# Patient Record
Sex: Male | Born: 1985
Health system: Southern US, Community
[De-identification: ages and names within clinical notes are randomized; demographics above are authoritative.]

## PROBLEM LIST (undated history)

## (undated) DIAGNOSIS — E785 Hyperlipidemia, unspecified: Secondary | ICD-10-CM

## (undated) DIAGNOSIS — Z8669 Personal history of other diseases of the nervous system and sense organs: Secondary | ICD-10-CM

## (undated) DIAGNOSIS — R4586 Emotional lability: Secondary | ICD-10-CM

## (undated) DIAGNOSIS — I10 Essential (primary) hypertension: Secondary | ICD-10-CM

## (undated) DIAGNOSIS — Z973 Presence of spectacles and contact lenses: Secondary | ICD-10-CM

## (undated) DIAGNOSIS — C439 Malignant melanoma of skin, unspecified: Secondary | ICD-10-CM

## (undated) HISTORY — DX: Essential (primary) hypertension: I10

## (undated) HISTORY — DX: Personal history of other diseases of the nervous system and sense organs: Z86.69

## (undated) HISTORY — DX: Emotional lability: R45.86

## (undated) HISTORY — DX: Hyperlipidemia, unspecified: E78.5

## (undated) HISTORY — DX: Malignant melanoma of skin, unspecified: C43.9

## (undated) HISTORY — DX: Presence of spectacles and contact lenses: Z97.3

---

## 2013-12-02 ENCOUNTER — Ambulatory Visit (INDEPENDENT_AMBULATORY_CARE_PROVIDER_SITE_OTHER): Payer: Managed Care, Other (non HMO) | Admitting: Family Medicine

## 2013-12-02 ENCOUNTER — Encounter: Payer: Self-pay | Admitting: Family Medicine

## 2013-12-02 VITALS — BP 130/80 | HR 78 | Resp 16 | Ht 69.0 in | Wt 196.0 lb

## 2013-12-02 DIAGNOSIS — Z8269 Family history of other diseases of the musculoskeletal system and connective tissue: Secondary | ICD-10-CM

## 2013-12-02 DIAGNOSIS — F411 Generalized anxiety disorder: Secondary | ICD-10-CM

## 2013-12-02 DIAGNOSIS — R079 Chest pain, unspecified: Secondary | ICD-10-CM

## 2013-12-02 DIAGNOSIS — Z0000 Encounter for general adult medical examination without abnormal findings: Secondary | ICD-10-CM

## 2013-12-02 LAB — COMPLETE METABOLIC PANEL WITH GFR
ALT: 13 U/L (ref 0–53)
AST: 12 U/L (ref 0–37)
Albumin: 4.9 g/dL (ref 3.5–5.2)
Alkaline Phosphatase: 63 U/L (ref 39–117)
BILIRUBIN TOTAL: 0.7 mg/dL (ref 0.2–1.2)
BUN: 13 mg/dL (ref 6–23)
CALCIUM: 10.2 mg/dL (ref 8.4–10.5)
CHLORIDE: 102 meq/L (ref 96–112)
CO2: 30 meq/L (ref 19–32)
CREATININE: 1.19 mg/dL (ref 0.50–1.35)
GFR, EST NON AFRICAN AMERICAN: 83 mL/min
GFR, Est African American: >89 mL/min
GLUCOSE: 94 mg/dL (ref 70–99)
Potassium: 5 mEq/L (ref 3.5–5.3)
Sodium: 138 mEq/L (ref 135–145)
Total Protein: 7.7 g/dL (ref 6.0–8.3)

## 2013-12-02 LAB — LIPID PANEL
CHOL/HDL RATIO: 5.5 ratio
CHOLESTEROL: 214 mg/dL — AB (ref 0–200)
HDL: 39 mg/dL — ABNORMAL LOW (ref >39)
LDL Cholesterol: 141 mg/dL — ABNORMAL HIGH (ref 0–99)
Triglycerides: 170 mg/dL — ABNORMAL HIGH (ref <150)
VLDL: 34 mg/dL (ref 0–40)

## 2013-12-02 LAB — CBC WITH DIFFERENTIAL/PLATELET
Basophils Absolute: 0 10*3/uL (ref 0.0–0.1)
Basophils Relative: 0 % (ref 0–1)
Eosinophils Absolute: 0.1 10*3/uL (ref 0.0–0.7)
Eosinophils Relative: 1 % (ref 0–5)
HCT: 47.4 % (ref 39.0–52.0)
HEMOGLOBIN: 16.3 g/dL (ref 13.0–17.0)
LYMPHS ABS: 3.5 10*3/uL (ref 0.7–4.0)
Lymphocytes Relative: 46 % (ref 12–46)
MCH: 29.1 pg (ref 26.0–34.0)
MCHC: 34.4 g/dL (ref 30.0–36.0)
MCV: 84.5 fL (ref 78.0–100.0)
MONOS PCT: 7 % (ref 3–12)
Monocytes Absolute: 0.5 10*3/uL (ref 0.1–1.0)
NEUTROS ABS: 3.5 10*3/uL (ref 1.7–7.7)
Neutrophils Relative %: 46 % (ref 43–77)
Platelets: 356 10*3/uL (ref 150–400)
RBC: 5.61 MIL/uL (ref 4.22–5.81)
RDW: 12.7 % (ref 11.5–15.5)
WBC: 7.6 10*3/uL (ref 4.0–10.5)

## 2013-12-02 NOTE — Patient Instructions (Addendum)
Keeping you healthy  Get these tests  Blood pressure- Have your blood pressure checked once a year by your healthcare provider.  Normal blood pressure is 120/80.  Weight- Have your body mass index (BMI) calculated to screen for obesity.  BMI is a measure of body fat based on height and weight. You can also calculate your own BMI at [employer]velBags.it.  Cholesterol- Have your cholesterol checked regularly starting at age 28, sooner may be necessary if you have diabetes, high blood pressure, if a family member developed heart diseases at an early age or if you smoke.   Chlamydia, HIV, and other sexual transmitted disease- Get screened each year until the age of 39 then within three months of each new sexual partner.  Diabetes- Have your blood sugar checked regularly if you have high blood pressure, high cholesterol, a family history of diabetes or if you are overweight.  Get these vaccines  Flu shot- Every fall.  Tetanus shot- Every 10 years.  Menactra- Single dose; prevents meningitis.  Take these steps  Don't smoke- If you do smoke, ask your healthcare provider about quitting. For tips on how to quit, go to www.smokefree.gov or call 1-800-QUIT-NOW.  Be physically active- Exercise 5 days a week for at least 30 minutes.  If you are not already physically active start slow and gradually work up to 30 minutes of moderate physical activity.  Examples of moderate activity include walking briskly, mowing the yard, dancing, swimming bicycling, etc.  Eat a healthy diet- Eat a variety of healthy foods such as fruits, vegetables, low fat milk, low fat cheese, yogurt, lean meats, poultry, fish, beans, tofu, etc.  For more information on healthy eating, go to www.thenutritionsource.org  Drink alcohol in moderation- Limit alcohol intake two drinks or less a day.  Never drink and drive.  Dentist- Brush and floss teeth twice daily; visit your dentis twice a year.  Depression-Your emotional  health is as important as your physical health.  If you're feeling down, losing interest in things you normally enjoy please talk with your healthcare provider.  Gun Safety- If you keep a gun in your home, keep it unloaded and with the safety lock on.  Bullets should be stored separately.  Helmet use- Always wear a helmet when riding a motorcycle, bicycle, rollerblading or skateboarding.  Safe sex- If you may be exposed to a sexually transmitted infection, use a condom  Seat belts- Seat bels can save your life; always wear one.  Smoke/Carbon Monoxide detectors- These detectors need to be installed on the appropriate level of your home.  Replace batteries at least once a year.  Skin Cancer- When out in the sun, cover up and use sunscreen SPF 15 or higher.  Violence- If anyone is threatening or hurting you, please tell your healthcare provider.    Generalized Anxiety Disorder Generalized anxiety disorder (GAD) is a mental disorder. It interferes with life functions, including relationships, work, and school. GAD is different from normal anxiety, which everyone experiences at some point in their lives in response to specific life events and activities. Normal anxiety actually helps [place] prepare for and get through these life events and activities. Normal anxiety goes away after the event or activity is over.  GAD causes anxiety that is not necessarily related to specific events or activities. It also causes excess anxiety in proportion to specific events or activities. The anxiety associated with GAD is also difficult to control. GAD can vary from mild to severe. People with severe GAD  can have intense waves of anxiety with physical symptoms (panic attacks).  SYMPTOMS The anxiety and worry associated with GAD are difficult to control. This anxiety and worry are related to many life events and activities and also occur more days than not for 6 months or longer. People with GAD also have three or  more of the following symptoms (one or more in children):  Restlessness.   Fatigue.  Difficulty concentrating.   Irritability.  Muscle tension.  Difficulty sleeping or unsatisfying sleep. DIAGNOSIS GAD is diagnosed through an assessment by your caregiver. Your caregiver will ask you questions aboutyour mood,physical symptoms, and events in your life. Your caregiver may ask you about your medical history and use of alcohol or drugs, including prescription medications. Your caregiver may also do a physical exam and blood tests. Certain medical conditions and the use of certain substances can cause symptoms similar to those associated with GAD. Your caregiver may refer you to a mental health specialist for further evaluation. TREATMENT The following therapies are usually used to treat GAD:   Medication Antidepressant medication usually is prescribed for long-term daily control. Antianxiety medications may be added in severe cases, especially when panic attacks occur.   Talk therapy (psychotherapy) Certain types of talk therapy can be helpful in treating GAD by providing support, education, and guidance. A form of talk therapy called cognitive behavioral therapy can teach you healthy ways to think about and react to daily life events and activities.  Stress managementtechniques These include yoga, meditation, and exercise and can be very helpful when they are practiced regularly. A mental health specialist can help determine which treatment is best for you. Some people see improvement with one therapy. However, other people require a combination of therapies. Document Released: 01/11/2013 Document Reviewed: 01/11/2013 [facility] Patient Information 2014 [place], [place].

## 2013-12-02 NOTE — Progress Notes (Signed)
Subjective:    Patient ID: Mark Dunn, male    DOB: June 06, 1986, 28 y.o.   MRN: [id number]  HPI  This 28 y.o. Cauc male is here to establish care at [facility]. His last CPE was early 2014 at a practice in [place], [place]. He and his wife share a diagnosis of anxiety; this problem is situational for the pt and is accompanied by SOB and sharp chest pains in the L axilla, lasting < 5 minutes.  He does consume caffeine (one 12- ounce soda minimum, 5 sodas maximum); he cannot drink Pepsi because it gives him migraines.  He works as a Development worker, international aid; CP not exacerbated by work. Pt and wife suffered a stillborn birth of child when they were in high school (she was 28 yrs old and having a home birth; she delivered a stillborn at their local hospital).  Family history significant for brother diagnosed w/ Fredderick Phenix Danlos Syndrome. Pt has no history of abnormal joint laxity or dislocations or easy bruising or slow healing.  Review of Systems  Constitutional: Negative.   HENT:       Frequent URIs and colds.  Eyes: Positive for photophobia. Negative for pain, redness and visual disturbance.  Respiratory: Positive for chest tightness and shortness of breath. Negative for cough, choking and wheezing.   Cardiovascular: Positive for chest pain. Negative for palpitations and leg swelling.  Gastrointestinal: Negative.   Endocrine: Negative.   Musculoskeletal: Negative.   Skin: Negative.   Allergic/Immunologic: Negative.   Neurological: Positive for speech difficulty and headaches. Negative for dizziness, tremors, syncope, weakness, light-headedness and numbness.  Hematological: Negative.   Psychiatric/Behavioral: Negative for behavioral problems, sleep disturbance, dysphoric mood and agitation. The patient is nervous/anxious. The patient is not hyperactive.        Objective:   Physical Exam  Nursing note and vitals reviewed. Constitutional: He is oriented to person, place, and time. Vital signs are normal. He  appears well-developed and well-nourished. No distress.  HENT:  Head: Normocephalic and atraumatic.  Right Ear: Hearing, tympanic membrane, external ear and ear canal normal.  Left Ear: Hearing, tympanic membrane, external ear and ear canal normal.  Nose: Nose normal. No nasal deformity or septal deviation.  Mouth/Throat: Uvula is midline, oropharynx is clear and moist and mucous membranes are normal. No oral lesions. Normal dentition.  Eyes: Conjunctivae, EOM and lids are normal. Pupils are equal, round, and reactive to light. No scleral icterus.  Fundoscopic exam:      The right eye shows no arteriolar narrowing, no AV nicking and no papilledema. The right eye shows red reflex.       The left eye shows no arteriolar narrowing, no AV nicking and no papilledema. The left eye shows red reflex.  Neck: Trachea normal, normal range of motion and full passive range of motion without pain. Neck supple. No spinous process tenderness and no muscular tenderness present. No mass and no thyromegaly present.  Cardiovascular: Normal rate, regular rhythm, S1 normal, S2 normal, normal heart sounds and normal pulses.   No extrasystoles are present. PMI is not displaced.  Exam reveals no gallop and no friction rub.   No murmur heard. Pulmonary/Chest: Effort normal and breath sounds normal. No respiratory distress.  Abdominal: Soft. Normal appearance and bowel sounds are normal. He exhibits no distension, no abdominal bruit and no mass. There is no hepatosplenomegaly. There is no tenderness. There is no rigidity, no guarding and no CVA tenderness. No hernia.  Musculoskeletal: Normal range of motion. He exhibits no  edema and no tenderness.       Cervical back: Normal.       Thoracic back: Normal.       Lumbar back: Normal.  Neurological: He is alert and oriented to person, place, and time. He has normal strength. He displays no atrophy and no tremor. No cranial nerve deficit or sensory deficit. He exhibits normal  muscle tone. Coordination and gait normal.  Reflex Scores:      Tricep reflexes are 1+ on the right side and 1+ on the left side.      Bicep reflexes are 1+ on the right side and 1+ on the left side.      Brachioradialis reflexes are 0 on the right side and 0 on the left side.      Patellar reflexes are 2+ on the right side and 2+ on the left side. Skin: Skin is warm, dry and intact. No ecchymosis, no petechiae and no rash noted. He is not diaphoretic. No cyanosis or erythema. No pallor. Nails show no clubbing.  Psychiatric: He has a normal mood and affect. His speech is normal and behavior is normal. Judgment and thought content normal. Cognition and memory are normal.    ECG:  NSR; rate= 58. No ST-TW changes. No ectopy.       Assessment & Plan:  Routine general medical examination at a health care facility - Plan: TSH, COMPLETE METABOLIC PANEL WITH GFR, CBC with Differential, Lipid panel, T4, free, T3, Free  Generalized anxiety disorder - Pt will continue to work on healthy ways to address anxiety symptoms and will reduce caffeine intake.  Plan: TSH, COMPLETE METABOLIC PANEL WITH GFR, CBC with Differential, Lipid panel, T4, free, T3, Free  Chest pain - Anxiety related.  Limit caffeine intake. Plan: TSH, COMPLETE METABOLIC PANEL WITH GFR, CBC with Differential, Lipid panel, T4, free, T3, Free, Vitamin D, 25-hydroxy  Family history of musculoskeletal disease - Ehlers Danlos Syndrome in brother who has "aortic inflammation and enlarged heart" per pt hx. Plan: Ambulatory referral to [facility]

## 2013-12-03 LAB — TSH: TSH: 1.072 u[IU]/mL (ref 0.350–4.500)

## 2013-12-03 LAB — T4, FREE: Free T4: 1.23 ng/dL (ref 0.80–1.80)

## 2013-12-03 LAB — VITAMIN D 25 HYDROXY (VIT D DEFICIENCY, FRACTURES): VIT D 25 HYDROXY: 41 ng/mL (ref 30–89)

## 2013-12-03 LAB — T3, FREE: T3, Free: 3.3 pg/mL (ref 2.3–4.2)

## 2013-12-03 NOTE — Progress Notes (Signed)
Quick Note:  Please advise pt regarding following labs... Your lipid panel shows above normal total and LDL ("bad") cholesterol as well as elevated triglycerides. HDL ("good") cholesterol is just barely normal. Better nutrition will help improve these numbers. If this is a problem that runs in your family, the best nutrition will not get your numbers to normal. Your heart disease risk is average based on these numbers.   Get an over-the-counter Fish Oil supplement 1200 mg and take 1 capsule daily. Take any oil-containing supplements alone (separate from other solid pills and capsules). Petra Kuba Made is a good brand; Schiff MegaRed Astrid Drafts is a very good but more costly brand. Stay active and eat healthy; these numbers should be rechecked in 6 months.  All other labs look great!  Copy to pt. ______

## 2013-12-06 ENCOUNTER — Encounter: Payer: Self-pay | Admitting: Family Medicine

## 2014-07-08 ENCOUNTER — Ambulatory Visit (INDEPENDENT_AMBULATORY_CARE_PROVIDER_SITE_OTHER): Payer: Managed Care, Other (non HMO) | Admitting: Family Medicine

## 2014-07-08 VITALS — BP 145/80 | HR 72 | Temp 98.4°F | Resp 18 | Ht 70.0 in | Wt 208.0 lb

## 2014-07-08 DIAGNOSIS — J029 Acute pharyngitis, unspecified: Secondary | ICD-10-CM

## 2014-07-08 LAB — POCT RAPID STREP A (OFFICE): Rapid Strep A Screen: NEGATIVE

## 2014-07-08 NOTE — Progress Notes (Signed)
   Subjective:    Patient ID: Mark Dunn, male    DOB: Aug 16, 1986, 28 y.o.   MRN: [id number]  HPI Patient presents with scratchy, sore throat and dry cough for 5 days. Had a bad headache 4 days ago, mild headache today. Took some claritin d and mucinex with a little relief.    Review of Systems No fever/chills, no ear pain, no chest pain, no SOB, no wheeze. Sleeping ok, appetite normal.     Objective:   Physical Exam  Vitals reviewed. Constitutional: He is oriented to person, place, and time. He appears well-developed and well-nourished.  HENT:  Head: Normocephalic and atraumatic.  Right Ear: Tympanic membrane, external ear and ear canal normal.  Left Ear: Tympanic membrane, external ear and ear canal normal.  Nose: Mucosal edema present. No rhinorrhea. Right sinus exhibits no maxillary sinus tenderness and no frontal sinus tenderness. Left sinus exhibits no maxillary sinus tenderness and no frontal sinus tenderness.  Mouth/Throat: Uvula is midline and mucous membranes are normal. Posterior oropharyngeal erythema present. No oropharyngeal exudate, posterior oropharyngeal edema or tonsillar abscesses.  Sounds congested.   Eyes: Conjunctivae are normal.  Neck: Normal range of motion. Neck supple.  Cardiovascular: Normal rate, regular rhythm and normal heart sounds.   Pulmonary/Chest: Effort normal and breath sounds normal.  Musculoskeletal: Normal range of motion.  Lymphadenopathy:    He has no cervical adenopathy.  Neurological: He is alert and oriented to person, place, and time.  Skin: Skin is warm and dry.  Psychiatric: He has a normal mood and affect. His behavior is normal. Judgment and thought content normal.      Assessment & Plan:  1. Acute pharyngitis, unspecified pharyngitis type - POCT rapid strep A - Culture, Group A Strep -Provided written and verbal information regarding diagnosis and treatment. -For congestion use over the counter sudafed and afrin (generic  fine) Increase fluids, continue Mucinex Chloraseptic throat spray as needed  -RTC if worsening symptoms or if no improvement in 3-4 days.  Elby Beck, FNP-BC  [facility] [place] [facility]  07/10/2014 8:26 PM

## 2014-07-08 NOTE — Patient Instructions (Signed)
For congestion use over the counter sudafed and afrin (generic fine) Increase fluids Chloraseptic throat spray as needed   Pharyngitis Pharyngitis is redness, pain, and swelling (inflammation) of your pharynx.  CAUSES  Pharyngitis is usually caused by infection. Most of the time, these infections are from viruses (viral) and are part of a cold. However, sometimes pharyngitis is caused by bacteria (bacterial). Pharyngitis can also be caused by allergies. Viral pharyngitis may be spread from person to person by coughing, sneezing, and personal items or utensils (cups, forks, spoons, toothbrushes). Bacterial pharyngitis may be spread from person to person by more intimate contact, such as kissing.  SIGNS AND SYMPTOMS  Symptoms of pharyngitis include:   Sore throat.   Tiredness (fatigue).   Low-grade fever.   Headache.  Joint pain and muscle aches.  Skin rashes.  Swollen lymph nodes.  Plaque-like film on throat or tonsils (often seen with bacterial pharyngitis). DIAGNOSIS  Your health care provider will ask you questions about your illness and your symptoms. Your medical history, along with a physical exam, is often all that is needed to diagnose pharyngitis. Sometimes, a rapid strep test is done. Other lab tests may also be done, depending on the suspected cause.  TREATMENT  Viral pharyngitis will usually get better in 3-4 days without the use of medicine. Bacterial pharyngitis is treated with medicines that kill germs (antibiotics).  HOME CARE INSTRUCTIONS   Drink enough water and fluids to keep your urine clear or pale yellow.   Only take over-the-counter or prescription medicines as directed by your health care provider:   If you are prescribed antibiotics, make sure you finish them even if you start to feel better.   Do not take aspirin.   Get lots of rest.   Gargle with 8 oz of salt water ( tsp of salt per 1 qt of water) as often as every 1-2 hours to soothe your  throat.   Throat lozenges (if you are not at risk for choking) or sprays may be used to soothe your throat. SEEK MEDICAL CARE IF:   You have large, tender lumps in your neck.  You have a rash.  You cough up green, yellow-brown, or bloody spit. SEEK IMMEDIATE MEDICAL CARE IF:   Your neck becomes stiff.  You drool or are unable to swallow liquids.  You vomit or are unable to keep medicines or liquids down.  You have severe pain that does not go away with the use of recommended medicines.  You have trouble breathing (not caused by a stuffy nose). MAKE SURE YOU:   Understand these instructions.  Will watch your condition.  Will get help right away if you are not doing well or get worse. Document Released: 09/16/2005 Document Revised: 07/07/2013 Document Reviewed: 05/24/2013 [facility] Patient Information 2015 [place], [place]. This information is not intended to replace advice given to you by your health care provider. Make sure you discuss any questions you have with your health care provider.

## 2014-07-10 LAB — CULTURE, GROUP A STREP: ORGANISM ID, BACTERIA: NORMAL

## 2014-09-30 DIAGNOSIS — I10 Essential (primary) hypertension: Secondary | ICD-10-CM

## 2014-09-30 HISTORY — DX: Essential (primary) hypertension: I10

## 2015-03-23 ENCOUNTER — Other Ambulatory Visit: Payer: Self-pay | Admitting: Family Medicine

## 2015-03-23 ENCOUNTER — Ambulatory Visit
Admission: RE | Admit: 2015-03-23 | Discharge: 2015-03-23 | Disposition: A | Discharge status: Home / Self Care | Payer: Commercial Indemnity | Source: Ambulatory Visit | Attending: Family Medicine | Admitting: Family Medicine

## 2015-03-23 DIAGNOSIS — Q796 Ehlers-Danlos syndrome, unspecified: Secondary | ICD-10-CM

## 2015-03-23 DIAGNOSIS — I77819 Aortic ectasia, unspecified site: Secondary | ICD-10-CM

## 2015-10-11 IMAGING — CR DG CHEST 2V
2 series · 2 of 2 positions shown · non-contrast
Comparison: None.

CLINICAL DATA: No chest complaints, family history of Ehlers-Danlos
syndrome

EXAM:
CHEST  2 VIEW

[w chest pa]
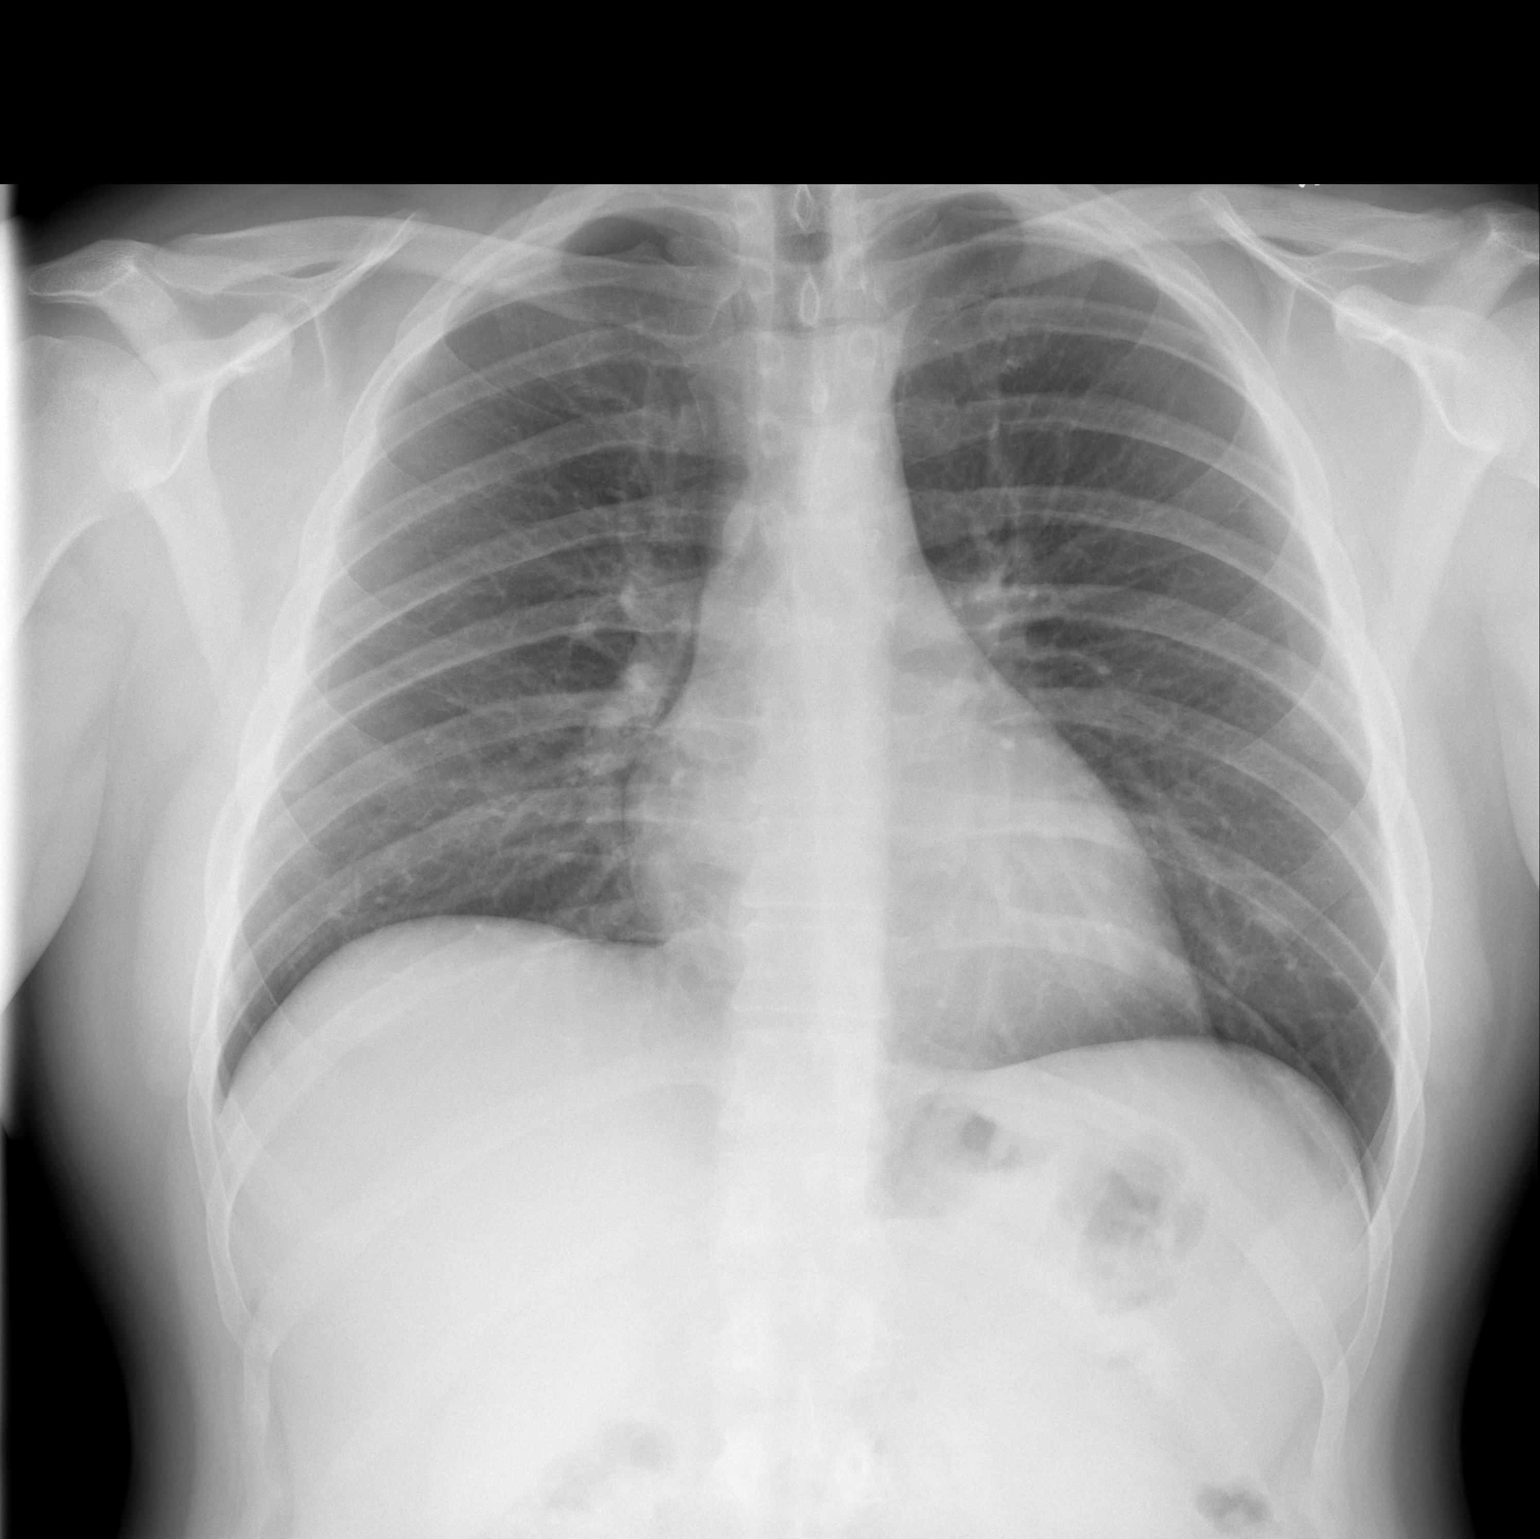

[w chest lat]
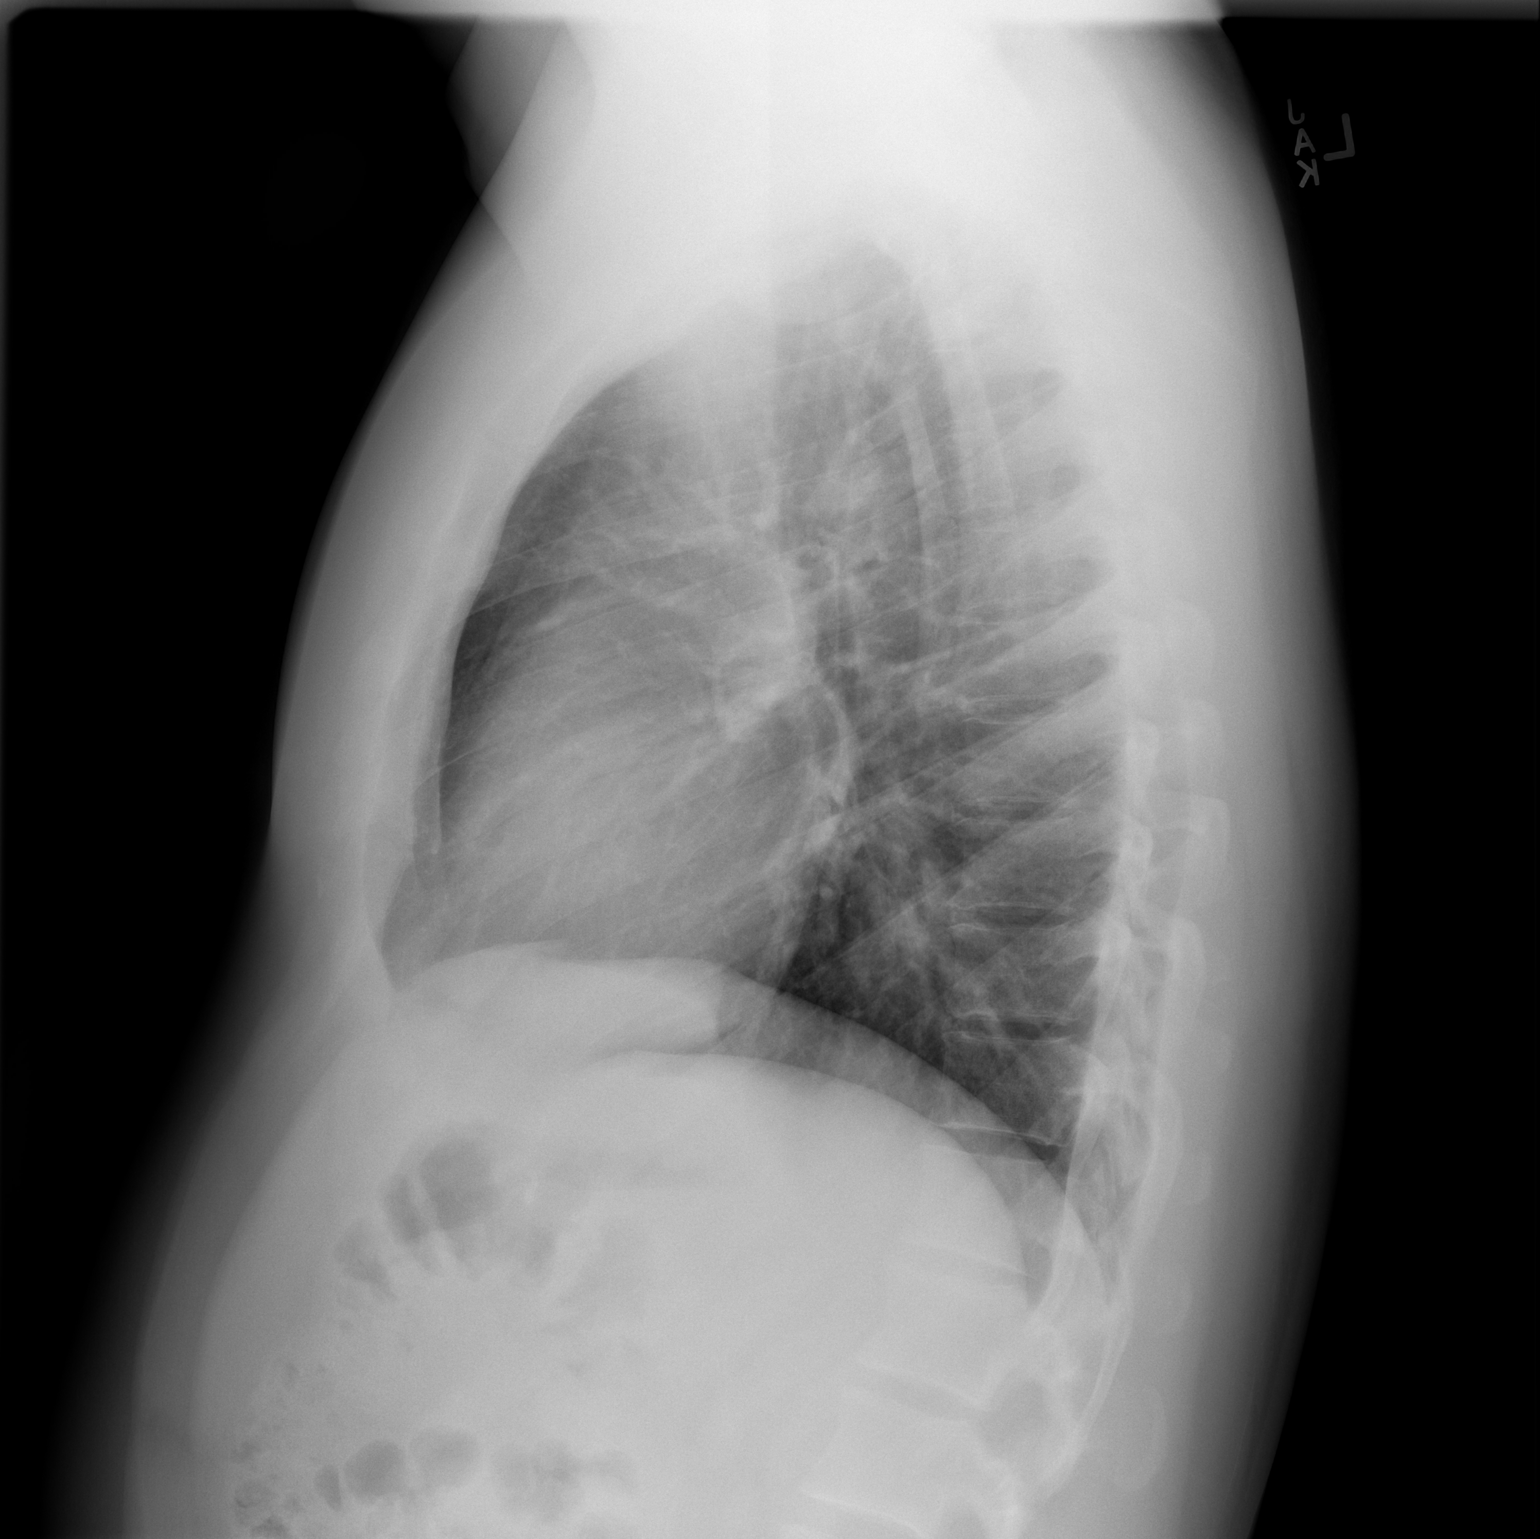

[2 of 2 positions shown; findings below may reference images not displayed]

FINDINGS: The heart size and mediastinal contours are within normal limits.
Both lungs are clear. The visualized skeletal structures are
unremarkable.
IMPRESSION: No active cardiopulmonary disease.

## 2017-07-21 ENCOUNTER — Encounter: Payer: Self-pay | Admitting: Medical

## 2017-07-21 ENCOUNTER — Ambulatory Visit (INDEPENDENT_AMBULATORY_CARE_PROVIDER_SITE_OTHER): Payer: BLUE CROSS/BLUE SHIELD | Admitting: Medical

## 2017-07-21 VITALS — BP 144/82 | HR 67 | Ht 69.0 in | Wt 205.0 lb

## 2017-07-21 DIAGNOSIS — R0683 Snoring: Secondary | ICD-10-CM | POA: Diagnosis not present

## 2017-07-21 DIAGNOSIS — Z8669 Personal history of other diseases of the nervous system and sense organs: Secondary | ICD-10-CM

## 2017-07-21 DIAGNOSIS — Z113 Encounter for screening for infections with a predominantly sexual mode of transmission: Secondary | ICD-10-CM | POA: Insufficient documentation

## 2017-07-21 DIAGNOSIS — I10 Essential (primary) hypertension: Secondary | ICD-10-CM | POA: Diagnosis not present

## 2017-07-21 DIAGNOSIS — Z0000 Encounter for general adult medical examination without abnormal findings: Secondary | ICD-10-CM | POA: Diagnosis not present

## 2017-07-21 DIAGNOSIS — L989 Disorder of the skin and subcutaneous tissue, unspecified: Secondary | ICD-10-CM | POA: Diagnosis not present

## 2017-07-21 DIAGNOSIS — M542 Cervicalgia: Secondary | ICD-10-CM

## 2017-07-21 DIAGNOSIS — Z136 Encounter for screening for cardiovascular disorders: Secondary | ICD-10-CM | POA: Insufficient documentation

## 2017-07-21 DIAGNOSIS — Z2821 Immunization not carried out because of patient refusal: Secondary | ICD-10-CM
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
## 2017-07-22 ENCOUNTER — Other Ambulatory Visit: Payer: Self-pay | Admitting: Medical

## 2017-07-22 LAB — COMPREHENSIVE METABOLIC PANEL
AG RATIO: 2.1 (calc) (ref 1.0–2.5)
ALT: 19 U/L (ref 9–46)
AST: 14 U/L (ref 10–40)
Albumin: 4.7 g/dL (ref 3.6–5.1)
Alkaline phosphatase (APISO): 59 U/L (ref 40–115)
BUN: 11 mg/dL (ref 7–25)
CALCIUM: 9.9 mg/dL (ref 8.6–10.3)
CO2: 30 mmol/L (ref 20–32)
Chloride: 102 mmol/L (ref 98–110)
Creat: 1.1 mg/dL (ref 0.60–1.35)
GLUCOSE: 90 mg/dL (ref 65–99)
Globulin: 2.2 g/dL (calc) (ref 1.9–3.7)
Potassium: 4.4 mmol/L (ref 3.5–5.3)
SODIUM: 139 mmol/L (ref 135–146)
Total Bilirubin: 0.8 mg/dL (ref 0.2–1.2)
Total Protein: 6.9 g/dL (ref 6.1–8.1)

## 2017-07-22 LAB — CBC
HCT: 45.5 % (ref 38.5–50.0)
HEMOGLOBIN: 15.7 g/dL (ref 13.2–17.1)
MCH: 29.6 pg (ref 27.0–33.0)
MCHC: 34.5 g/dL (ref 32.0–36.0)
MCV: 85.7 fL (ref 80.0–100.0)
MPV: 10.6 fL (ref 7.5–12.5)
Platelets: 343 10*3/uL (ref 140–400)
RBC: 5.31 10*6/uL (ref 4.20–5.80)
RDW: 12 % (ref 11.0–15.0)
WBC: 7.7 10*3/uL (ref 3.8–10.8)

## 2017-07-22 LAB — LIPID PANEL
Cholesterol: 222 mg/dL — ABNORMAL HIGH (ref <200)
HDL: 35 mg/dL — ABNORMAL LOW (ref >40)
LDL Cholesterol (Calc): 148 mg/dL (calc) — ABNORMAL HIGH
NON-HDL CHOLESTEROL (CALC): 187 mg/dL — AB (ref <130)
Total CHOL/HDL Ratio: 6.3 (calc) — ABNORMAL HIGH (ref <5.0)
Triglycerides: 252 mg/dL — ABNORMAL HIGH (ref <150)

## 2017-07-22 LAB — TSH: TSH: 0.99 mIU/L (ref 0.40–4.50)

## 2017-07-22 LAB — HEMOGLOBIN A1C
EAG (MMOL/L): 6 (calc)
HEMOGLOBIN A1C: 5.4 %{Hb} (ref <5.7)
MEAN PLASMA GLUCOSE: 108 (calc)

## 2017-07-22 LAB — TESTOSTERONE: TESTOSTERONE: 517 ng/dL (ref 250–827)

## 2017-07-22 MED ORDER — PRAVASTATIN SODIUM 20 MG PO TABS: 20.0000 mg | ORAL_TABLET | Freq: Every evening | ORAL | 0 refills | Status: DC

## 2017-08-20 ENCOUNTER — Ambulatory Visit (INDEPENDENT_AMBULATORY_CARE_PROVIDER_SITE_OTHER): Payer: BLUE CROSS/BLUE SHIELD | Admitting: Medical

## 2017-08-20 ENCOUNTER — Encounter: Payer: Self-pay | Admitting: Medical

## 2017-08-20 VITALS — BP 114/80 | HR 77 | Wt 214.0 lb

## 2017-08-20 DIAGNOSIS — E782 Mixed hyperlipidemia: Secondary | ICD-10-CM

## 2017-08-20 DIAGNOSIS — I10 Essential (primary) hypertension: Secondary | ICD-10-CM | POA: Diagnosis not present

## 2017-08-20 DIAGNOSIS — Z8669 Personal history of other diseases of the nervous system and sense organs: Secondary | ICD-10-CM

## 2017-08-20 NOTE — Progress Notes (Signed)
Subjective: Chief Complaint  Patient presents with  . Follow-up    follow up  b/p and labs    Here for f/u, accompanied by wife.   Last visit had elevated BPs.  He has been monitoring BPs.   Staying in the normal to mildly elevated range, typically under 130 SBP.  Maybe had 1 reading in the 90s DBP.   Has made some diet changes since last visit, has cut out ice cream.   Has cut down on soda.   Headaches - last visit was getting them often, but has had fewer headaches since last visit.   No other aggravating or relieving factors. No other complaint.   Past Medical History:  Diagnosis Date  . History of migraine    worse in the past during [place], triggered prior by sugar intake  . Hyperlipidemia   . Hypertension 2016  . Mood changes    prior intermittent episodes of depressed mood?  as of 06/2017  . Wears contact lenses    Current Outpatient Medications on File Prior to Visit  Medication Sig Dispense Refill  . pravastatin (PRAVACHOL) 20 MG tablet Take 1 tablet (20 mg total) by mouth every evening. 90 tablet 0   No current facility-administered medications on file prior to visit.    ROS as in subjective   Objective: BP 114/80   Pulse 77   Wt 214 lb (97.1 kg)   SpO2 99%   BMI 31.60 kg/m   General appearance: alert, no distress, WD/WN,  Neck: supple, no lymphadenopathy, no thyromegaly, no masses Heart: RRR, normal S1, S2, no murmurs Lungs: CTA bilaterally, no wheezes, rhonchi, or rales Ext: no edema Pulses: 2+ symmetric, upper and lower extremities, normal cap refill     Assessment: Encounter Diagnoses  Name Primary?  . Essential hypertension, benign Yes  . Mixed dyslipidemia   . History of migraine     Plan BP improved, not currently on medication.  C/t to monitor dyslipidemia - compliant with labs.   Plan f/u for labs in a few weeks.   He will c/t to work on healthy diet, exercise headaches improved.   [place] was seen today for  follow-up.  Diagnoses and all orders for this visit:  Essential hypertension, benign  Mixed dyslipidemia -     ALT; Future -     Lipid panel; Future  History of migraine

## 2017-09-30 DIAGNOSIS — C439 Malignant melanoma of skin, unspecified: Secondary | ICD-10-CM

## 2017-09-30 HISTORY — PX: SKIN SURGERY: SHX2413

## 2017-09-30 HISTORY — DX: Malignant melanoma of skin, unspecified: C43.9

## 2018-04-15 ENCOUNTER — Other Ambulatory Visit: Payer: Self-pay | Admitting: Oncology

## 2018-04-15 DIAGNOSIS — C4359 Malignant melanoma of other part of trunk: Secondary | ICD-10-CM

## 2018-05-05 ENCOUNTER — Other Ambulatory Visit: Payer: Commercial Indemnity

## 2018-05-11 ENCOUNTER — Ambulatory Visit
Admission: RE | Admit: 2018-05-11 | Discharge: 2018-05-11 | Disposition: A | Discharge status: Home / Self Care | Payer: BLUE CROSS/BLUE SHIELD | Source: Ambulatory Visit | Attending: Oncology | Admitting: Oncology

## 2018-05-11 DIAGNOSIS — R911 Solitary pulmonary nodule: Secondary | ICD-10-CM | POA: Diagnosis not present

## 2018-05-11 DIAGNOSIS — C4359 Malignant melanoma of other part of trunk: Secondary | ICD-10-CM

## 2018-05-11 DIAGNOSIS — Z85820 Personal history of malignant melanoma of skin: Secondary | ICD-10-CM | POA: Diagnosis not present

## 2018-05-11 MED ORDER — IOPAMIDOL (ISOVUE-300) INJECTION 61%
100.0000 mL | Freq: Once | INTRAVENOUS | Status: AC | PRN
  Administered 2018-05-11: 100 mL via INTRAVENOUS

## 2018-09-29 ENCOUNTER — Ambulatory Visit (INDEPENDENT_AMBULATORY_CARE_PROVIDER_SITE_OTHER): Payer: BLUE CROSS/BLUE SHIELD | Admitting: Medical

## 2018-09-29 ENCOUNTER — Encounter: Payer: Self-pay | Admitting: Medical

## 2018-09-29 VITALS — BP 122/84 | HR 66 | Temp 98.5°F | Ht 69.0 in | Wt 202.6 lb

## 2018-09-29 DIAGNOSIS — F411 Generalized anxiety disorder: Secondary | ICD-10-CM | POA: Diagnosis not present

## 2018-09-29 DIAGNOSIS — R0681 Apnea, not elsewhere classified: Secondary | ICD-10-CM | POA: Insufficient documentation

## 2018-09-29 DIAGNOSIS — Z23 Encounter for immunization: Secondary | ICD-10-CM | POA: Diagnosis not present

## 2018-09-29 DIAGNOSIS — Z0000 Encounter for general adult medical examination without abnormal findings: Secondary | ICD-10-CM

## 2018-09-29 DIAGNOSIS — E782 Mixed hyperlipidemia: Secondary | ICD-10-CM | POA: Diagnosis not present

## 2018-09-29 DIAGNOSIS — Z113 Encounter for screening for infections with a predominantly sexual mode of transmission: Secondary | ICD-10-CM

## 2018-09-29 DIAGNOSIS — R413 Other amnesia: Secondary | ICD-10-CM | POA: Insufficient documentation

## 2018-09-29 DIAGNOSIS — Z2821 Immunization not carried out because of patient refusal: Secondary | ICD-10-CM

## 2018-09-29 DIAGNOSIS — Z1331 Encounter for screening for depression: Secondary | ICD-10-CM | POA: Insufficient documentation

## 2018-09-29 DIAGNOSIS — R0683 Snoring: Secondary | ICD-10-CM | POA: Diagnosis not present

## 2018-09-29 DIAGNOSIS — C439 Malignant melanoma of skin, unspecified: Secondary | ICD-10-CM | POA: Insufficient documentation

## 2018-09-29 DIAGNOSIS — Z820 Family history of epilepsy and other diseases of the nervous system: Secondary | ICD-10-CM | POA: Insufficient documentation

## 2018-09-29 DIAGNOSIS — Z8669 Personal history of other diseases of the nervous system and sense organs: Secondary | ICD-10-CM

## 2018-09-29 LAB — POCT URINALYSIS DIP (PROADVANTAGE DEVICE)
Bilirubin, UA: NEGATIVE
Blood, UA: NEGATIVE
Glucose, UA: NEGATIVE mg/dL
Ketones, POC UA: NEGATIVE mg/dL
Leukocytes, UA: NEGATIVE
Nitrite, UA: NEGATIVE
Protein Ur, POC: NEGATIVE mg/dL
Specific Gravity, Urine: 1.025
Urobilinogen, Ur: 3.5
pH, UA: 6 (ref 5.0–8.0)
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
## 2018-09-29 NOTE — Patient Instructions (Signed)
I am glad you are doing ok and have made it thorugh 2019.   Lets make 2020 a better year.  We will call with lab results  We will refer you for sleep study  We will refer you to dermatology    Consider counseling.    [facility] [address], [place], [place] [address] [phone]   [facility] [phone] [address] [place], [place] [address]   [facility] [phone] [address], [place], [place] [address]   I would like to invite you to my church, [employer] if you are not attending church  Contact info: [address], [place], [place] [address]  [email or web address]

## 2018-09-29 NOTE — Addendum Note (Signed)
Addended by: Elyse Jarvis on: 09/29/2018 10:32 AM   Modules accepted: Orders

## 2018-09-29 NOTE — Addendum Note (Signed)
Addended by: Elyse Jarvis on: 09/29/2018 12:56 PM   Modules accepted: Orders

## 2018-09-29 NOTE — Addendum Note (Signed)
Addended by: Elyse Jarvis on: 09/29/2018 11:18 AM   Modules accepted: Orders

## 2018-09-30 LAB — COMPREHENSIVE METABOLIC PANEL
ALK PHOS: 66 IU/L (ref 39–117)
ALT: 20 IU/L (ref 0–44)
AST: 13 IU/L (ref 0–40)
Albumin/Globulin Ratio: 2.7 — ABNORMAL HIGH (ref 1.2–2.2)
Albumin: 4.8 g/dL (ref 3.5–5.5)
BUN/Creatinine Ratio: 11 (ref 9–20)
BUN: 13 mg/dL (ref 6–20)
Bilirubin Total: 0.6 mg/dL (ref 0.0–1.2)
CO2: 25 mmol/L (ref 20–29)
Calcium: 10.1 mg/dL (ref 8.7–10.2)
Chloride: 99 mmol/L (ref 96–106)
Creatinine, Ser: 1.14 mg/dL (ref 0.76–1.27)
GFR calc Af Amer: 98 mL/min/{1.73_m2} (ref >59)
GFR calc non Af Amer: 85 mL/min/{1.73_m2} (ref >59)
Globulin, Total: 1.8 g/dL (ref 1.5–4.5)
Glucose: 91 mg/dL (ref 65–99)
Potassium: 4.7 mmol/L (ref 3.5–5.2)
Sodium: 140 mmol/L (ref 134–144)
Total Protein: 6.6 g/dL (ref 6.0–8.5)

## 2018-09-30 LAB — LIPID PANEL
Chol/HDL Ratio: 5.9 ratio — ABNORMAL HIGH (ref 0.0–5.0)
Cholesterol, Total: 236 mg/dL — ABNORMAL HIGH (ref 100–199)
HDL: 40 mg/dL (ref >39)
LDL Calculated: 150 mg/dL — ABNORMAL HIGH (ref 0–99)
TRIGLYCERIDES: 230 mg/dL — AB (ref 0–149)
VLDL Cholesterol Cal: 46 mg/dL — ABNORMAL HIGH (ref 5–40)

## 2018-09-30 LAB — CBC WITH DIFFERENTIAL/PLATELET
Basophils Absolute: 0 10*3/uL (ref 0.0–0.2)
Basos: 0 %
EOS (ABSOLUTE): 0.1 10*3/uL (ref 0.0–0.4)
Eos: 1 %
Hematocrit: 44.7 % (ref 37.5–51.0)
Hemoglobin: 15.3 g/dL (ref 13.0–17.7)
Immature Grans (Abs): 0 10*3/uL (ref 0.0–0.1)
Immature Granulocytes: 1 %
Lymphocytes Absolute: 2.5 10*3/uL (ref 0.7–3.1)
Lymphs: 35 %
MCH: 29.6 pg (ref 26.6–33.0)
MCHC: 34.2 g/dL (ref 31.5–35.7)
MCV: 87 fL (ref 79–97)
Monocytes Absolute: 0.6 10*3/uL (ref 0.1–0.9)
Monocytes: 9 %
Neutrophils Absolute: 3.8 10*3/uL (ref 1.4–7.0)
Neutrophils: 54 %
Platelets: 356 10*3/uL (ref 150–450)
RBC: 5.17 x10E6/uL (ref 4.14–5.80)
RDW: 12.2 % — ABNORMAL LOW (ref 12.3–15.4)
WBC: 7 10*3/uL (ref 3.4–10.8)

## 2018-09-30 LAB — GC/CHLAMYDIA PROBE AMP
Chlamydia trachomatis, NAA: NEGATIVE
Neisseria gonorrhoeae by PCR: NEGATIVE

## 2018-09-30 LAB — TSH: TSH: 0.952 u[IU]/mL (ref 0.450–4.500)

## 2018-09-30 LAB — HIV ANTIBODY (ROUTINE TESTING W REFLEX): HIV Screen 4th Generation wRfx: NONREACTIVE

## 2018-09-30 LAB — RPR: RPR Ser Ql: NONREACTIVE

## 2018-09-30 LAB — VITAMIN B12: Vitamin B-12: 391 pg/mL (ref 232–1245)

## 2018-10-20 ENCOUNTER — Telehealth: Payer: Self-pay | Admitting: Medical

## 2018-10-20 NOTE — Telephone Encounter (Signed)
Requested records received from [facility]. Sending back for review.

## 2018-10-21 ENCOUNTER — Telehealth: Payer: Self-pay | Admitting: Medical

## 2018-10-21 NOTE — Telephone Encounter (Signed)
I spoke to the office manager at the dermatology office in [place].  She is aware of my concern that we never got copy on any information related to his melanoma diagnosis until last week when we called requesting information, despite the fact that we referred him over a year ago.

## 2018-10-21 NOTE — Telephone Encounter (Signed)
I called and left a message with [facility] in [place], [phone].  I left message with the office manager to call me back  This is regarding the referral we made over a year ago for a skin lesion.  We never received any type of documentation from them as far as feedback.  He ultimately was diagnosed with melanoma and had extensive follow-up and surgery and treatment, yet we were never notified.  I only found out about his diagnosis when he came in this year for his physical

## 2018-10-27 ENCOUNTER — Ambulatory Visit (HOSPITAL_BASED_OUTPATIENT_CLINIC_OR_DEPARTMENT_OTHER): Payer: BLUE CROSS/BLUE SHIELD

## 2018-10-28 ENCOUNTER — Ambulatory Visit (HOSPITAL_BASED_OUTPATIENT_CLINIC_OR_DEPARTMENT_OTHER): Payer: BLUE CROSS/BLUE SHIELD | Attending: Medical | Admitting: Internal Medicine

## 2018-10-28 VITALS — Ht 70.0 in | Wt 204.0 lb

## 2018-10-28 DIAGNOSIS — R0683 Snoring: Secondary | ICD-10-CM

## 2018-10-28 DIAGNOSIS — G4733 Obstructive sleep apnea (adult) (pediatric): Secondary | ICD-10-CM | POA: Diagnosis not present

## 2018-10-28 DIAGNOSIS — F411 Generalized anxiety disorder: Secondary | ICD-10-CM | POA: Diagnosis not present

## 2018-10-28 DIAGNOSIS — Z820 Family history of epilepsy and other diseases of the nervous system: Secondary | ICD-10-CM | POA: Diagnosis not present

## 2018-10-31 DIAGNOSIS — G4733 Obstructive sleep apnea (adult) (pediatric): Secondary | ICD-10-CM

## 2018-10-31 NOTE — Procedures (Signed)
    Patient Name: Mark Dunn, Mark Dunn Date: 10/28/2018 Gender: Male D.O.B: 1986-07-29 Age (years): 32 Referring Provider: Chana Bode Height (inches): 64 Interpreting Physician: Baird Lyons MD, ABSM Weight (lbs): 204 RPSGT: Jonna Coup BMI: 29 MRN: [id number] Neck Size: 17.50  CLINICAL INFORMATION Sleep Study Type: HST Indication for sleep study: Snoring Epworth Sleepiness Score: 13  SLEEP STUDY TECHNIQUE A multi-channel overnight portable sleep study was performed. The channels recorded were: nasal airflow, thoracic respiratory movement, and oxygen saturation with a pulse oximetry. Snoring was also monitored.  MEDICATIONS Patient self administered medications include: none reported.  SLEEP ARCHITECTURE Patient was studied for 434 minutes. The sleep efficiency was 99.7 % and the patient was supine for 58.1%. The arousal index was 0.0 per hour.  RESPIRATORY PARAMETERS The overall AHI was 19.8 per hour, with a central apnea index of 0.0 per hour. The oxygen nadir was 85% during sleep.  CARDIAC DATA Mean heart rate during sleep was 59.8 bpm.  IMPRESSIONS - Moderate obstructive sleep apnea occurred during this study (AHI = 19.8/h). - No significant central sleep apnea occurred during this study (CAI = 0.0/h). - Moderate oxygen desaturation was noted during this study (Min O2 = 85%). Mean sat 94%. - Patient snored.  DIAGNOSIS - Obstructive Sleep Apnea (327.23 [G47.33 ICD-10])  RECOMMENDATIONS - Suggest CPAP autopap or CPAP titration sleep study. Other options including Sleep medical consultation, fitted oral appliance, or ENT evaluation, would be based on clinical judgment. - Be careful with alcohol, sedatives and other CNS depressants that may worsen sleep apnea and disrupt normal sleep architecture. - Sleep hygiene should be reviewed to assess factors that may improve sleep quality. - Weight management and regular exercise should be initiated or  continued.  [Electronically signed] 10/31/2018 11:01 AM  Baird Lyons MD, ABSM Diplomate, American Board of Sleep Medicine   NPI: [id number]                         [facility]  ELECTRONICALLY SIGNED ON:  10/31/2018, 10:58 AM [place] PH: [phone]   FX: [phone] [place]

## 2018-11-02 NOTE — Progress Notes (Signed)
Patient has an appointment on 11-11-18 at 415

## 2018-11-02 NOTE — Progress Notes (Signed)
Lets get him back in to discuss sleep study results.   (if he voices concerns about being limiting for getting back in for appt, let me know.  Otherwise get in to discuss results and next steps)

## 2018-11-03 ENCOUNTER — Telehealth: Payer: Self-pay | Admitting: Medical

## 2018-11-03 NOTE — Telephone Encounter (Signed)
Please update status on dermatology referral to [facility]?

## 2018-11-03 NOTE — Telephone Encounter (Signed)
Pt's wife called and left a message. Pt needs a referral to dermatology. She states they were to wait until previous records arrived. Per pt's chart those are now here. Please advise pt.

## 2018-11-04 NOTE — Telephone Encounter (Signed)
Patient aware of Dermatology Appointment.

## 2018-11-04 NOTE — Telephone Encounter (Signed)
Make sure he is aware

## 2018-11-04 NOTE — Telephone Encounter (Signed)
Patient has an appointment with Lyndle Herrlich on 12-08-18 at 11 with [facility].

## 2018-11-09 ENCOUNTER — Encounter: Payer: Self-pay | Admitting: Medical

## 2018-11-11 ENCOUNTER — Ambulatory Visit: Payer: BLUE CROSS/BLUE SHIELD | Admitting: Medical

## 2018-11-11 ENCOUNTER — Encounter: Payer: Self-pay | Admitting: Medical

## 2018-11-11 VITALS — BP 138/80 | HR 70 | Temp 98.3°F | Resp 16 | Ht 69.0 in | Wt 210.6 lb

## 2018-11-11 DIAGNOSIS — E782 Mixed hyperlipidemia: Secondary | ICD-10-CM

## 2018-11-11 DIAGNOSIS — G4733 Obstructive sleep apnea (adult) (pediatric): Secondary | ICD-10-CM | POA: Diagnosis not present

## 2018-11-11 NOTE — Progress Notes (Signed)
Subjective: Chief Complaint  Patient presents with  . consult    discuss sleep study   Here for consult about sleep apnea.  Accompanied by wife.   He note for years even when he weight less, he snored all the time.   Wife says he continues to snore nightly, loud.  There has been witnessed apnea.  He reports daytime somnolence, non restful sleep.   He has made some changes in diet since last visit.  Active with walking such as close to 20,000 steps daily.   No other aggravating or relieving factors. No other complaint.   Past Medical History:  Diagnosis Date  . History of migraine    worse in the past during [place], triggered prior by sugar intake  . Hyperlipidemia   . Hypertension 2016  . Melanoma ([place]) 2019   [facility] in [place], [place].  [facility].  . Mood changes    prior intermittent episodes of depressed mood?  as of 06/2017  . Wears contact lenses    No current outpatient medications on file prior to visit.   No current facility-administered medications on file prior to visit.    ROS as in subjective    Objective: BP 138/80   Pulse 70   Temp 98.3 F (36.8 C) (Oral)   Resp 16   Ht 5\' 9"  (1.753 m)   Wt 210 lb 9.6 oz (95.5 kg)   SpO2 97%   BMI 31.10 kg/m   Wt Readings from Last 3 Encounters:  11/11/18 210 lb 9.6 oz (95.5 kg)  10/28/18 204 lb (92.5 kg)  09/29/18 202 lb 9.6 oz (91.9 kg)   Gen: wd, wn, nad otherwise not examined    Assessment: Encounter Diagnoses  Name Primary?  . OSA (obstructive sleep apnea) Yes  . Mixed dyslipidemia      Plan: discussed his recent home sleep study, showing moderate OSA, some oxygen desaturation.   counseled on treatment including weight loss, avoid supine sleep, raising head of bed, regular exercise, avoiding sedatives, and beginning trial of CPAP.     Mixed dyslipidemia - discussed recent lab findings and diet recommendations  Justino was seen today for consult.  Diagnoses and all orders  for this visit:  OSA (obstructive sleep apnea)  Mixed dyslipidemia  f/u 40mo on sleep study report

## 2018-11-19 ENCOUNTER — Other Ambulatory Visit: Payer: Self-pay | Admitting: Oncology

## 2018-11-19 DIAGNOSIS — C4359 Malignant melanoma of other part of trunk: Secondary | ICD-10-CM

## 2018-12-04 ENCOUNTER — Telehealth: Payer: Self-pay | Admitting: Medical

## 2018-12-04 NOTE — Telephone Encounter (Signed)
Received requested records from [facility]. Sending back for review.

## 2018-12-08 ENCOUNTER — Encounter: Payer: Self-pay | Admitting: Medical

## 2018-12-08 ENCOUNTER — Ambulatory Visit
Admission: RE | Admit: 2018-12-08 | Discharge: 2018-12-08 | Disposition: A | Discharge status: Home / Self Care | Payer: BLUE CROSS/BLUE SHIELD | Source: Ambulatory Visit | Attending: Oncology | Admitting: Oncology

## 2018-12-08 DIAGNOSIS — D225 Melanocytic nevi of trunk: Secondary | ICD-10-CM | POA: Diagnosis not present

## 2018-12-08 DIAGNOSIS — L821 Other seborrheic keratosis: Secondary | ICD-10-CM | POA: Diagnosis not present

## 2018-12-08 DIAGNOSIS — C439 Malignant melanoma of skin, unspecified: Secondary | ICD-10-CM | POA: Diagnosis not present

## 2018-12-08 DIAGNOSIS — D1801 Hemangioma of skin and subcutaneous tissue: Secondary | ICD-10-CM | POA: Diagnosis not present

## 2018-12-08 DIAGNOSIS — C4359 Malignant melanoma of other part of trunk: Secondary | ICD-10-CM

## 2018-12-08 DIAGNOSIS — L814 Other melanin hyperpigmentation: Secondary | ICD-10-CM | POA: Diagnosis not present

## 2018-12-08 MED ORDER — IOPAMIDOL (ISOVUE-300) INJECTION 61%
100.0000 mL | Freq: Once | INTRAVENOUS | Status: AC | PRN
  Administered 2018-12-08: 100 mL via INTRAVENOUS

## 2019-02-11 DIAGNOSIS — S61303A Unspecified open wound of left middle finger with damage to nail, initial encounter: Secondary | ICD-10-CM | POA: Diagnosis not present

## 2019-02-15 DIAGNOSIS — S61303D Unspecified open wound of left middle finger with damage to nail, subsequent encounter: Secondary | ICD-10-CM | POA: Diagnosis not present

## 2019-02-18 DIAGNOSIS — S61303D Unspecified open wound of left middle finger with damage to nail, subsequent encounter: Secondary | ICD-10-CM | POA: Diagnosis not present

## 2019-03-04 ENCOUNTER — Encounter: Payer: Self-pay | Admitting: Medical

## 2019-06-15 DIAGNOSIS — D225 Melanocytic nevi of trunk: Secondary | ICD-10-CM | POA: Diagnosis not present

## 2019-06-15 DIAGNOSIS — D235 Other benign neoplasm of skin of trunk: Secondary | ICD-10-CM | POA: Diagnosis not present

## 2019-06-15 DIAGNOSIS — Z85820 Personal history of malignant melanoma of skin: Secondary | ICD-10-CM | POA: Diagnosis not present

## 2019-06-15 DIAGNOSIS — L814 Other melanin hyperpigmentation: Secondary | ICD-10-CM | POA: Diagnosis not present

## 2019-10-04 ENCOUNTER — Other Ambulatory Visit: Payer: Self-pay

## 2019-10-04 ENCOUNTER — Encounter: Payer: Self-pay | Admitting: Medical

## 2019-10-04 ENCOUNTER — Ambulatory Visit (INDEPENDENT_AMBULATORY_CARE_PROVIDER_SITE_OTHER): Payer: BC Managed Care – PPO | Admitting: Medical

## 2019-10-04 VITALS — BP 110/70 | HR 74 | Temp 97.8°F | Ht 69.0 in | Wt 206.4 lb

## 2019-10-04 DIAGNOSIS — E782 Mixed hyperlipidemia: Secondary | ICD-10-CM | POA: Diagnosis not present

## 2019-10-04 DIAGNOSIS — Z2821 Immunization not carried out because of patient refusal: Secondary | ICD-10-CM | POA: Diagnosis not present

## 2019-10-04 DIAGNOSIS — Z6830 Body mass index (BMI) 30.0-30.9, adult: Secondary | ICD-10-CM | POA: Insufficient documentation

## 2019-10-04 DIAGNOSIS — G479 Sleep disorder, unspecified: Secondary | ICD-10-CM | POA: Insufficient documentation

## 2019-10-04 DIAGNOSIS — Z113 Encounter for screening for infections with a predominantly sexual mode of transmission: Secondary | ICD-10-CM

## 2019-10-04 DIAGNOSIS — Z0000 Encounter for general adult medical examination without abnormal findings: Secondary | ICD-10-CM

## 2019-10-04 DIAGNOSIS — Z8669 Personal history of other diseases of the nervous system and sense organs: Secondary | ICD-10-CM

## 2019-10-04 DIAGNOSIS — Z85820 Personal history of malignant melanoma of skin: Secondary | ICD-10-CM

## 2019-10-04 DIAGNOSIS — F411 Generalized anxiety disorder: Secondary | ICD-10-CM

## 2019-10-04 NOTE — Progress Notes (Signed)
Subjective:   HPI  Mark Dunn is a 34 y.o. male who presents for physical.   Chief Complaint  Patient presents with  . Annual Exam    with fasting labs    Medical care team includes: Lorrene Graef, Camelia Eng, PA-C here for primary care [facility], Allyson Sabal   Concerns None.  He needs yearly STD screen.   Wife is polygamous, where as he is monogamous.    He established with [facility] this past year after prior being seen in [place] for Melanoma.  Reviewed their medical, surgical, family, social, medication, and allergy history and updated chart as appropriate.  Past Medical History:  Diagnosis Date  . History of migraine    worse in the past during [place], triggered prior by sugar intake  . Hyperlipidemia   . Hypertension 2016  . Melanoma ([place]) 2019   [facility] in [place], [place].  [facility].  . Mood changes    prior intermittent episodes of depressed mood?  as of 06/2017  . Wears contact lenses     Past Surgical History:  Procedure Laterality Date  . SKIN SURGERY  2019    Social History   Socioeconomic History  . Marital status: Married    Spouse name: Not on file  . Number of children: Not on file  . Years of education: Not on file  . Highest education level: Not on file  Occupational History  . Not on file  Tobacco Use  . Smoking status: Former Smoker    Packs/day: 1.00    Years: 4.00    Pack years: 4.00    Types: Cigarettes    Quit date: 09/30/2002    Years since quitting: 17.0  . Smokeless tobacco: Never Used  Substance and Sexual Activity  . Alcohol use: No  . Drug use: No  . Sexual activity: Yes  Other Topics Concern  . 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  Married, but wife has other partners, no children.   10/2019   Social Determinants of Health   Financial Resource Strain:   . Difficulty of Paying Living Expenses:  Not on file  Food Insecurity:   . Worried About Charity fundraiser in the Last Year: Not on file  . Ran Out of Food in the Last Year: Not on file  Transportation Needs:   . Lack of Transportation (Medical): Not on file  . Lack of Transportation (Non-Medical): Not on file  Physical Activity:   . Days of Exercise per Week: Not on file  . Minutes of Exercise per Session: Not on file  Stress:   . Feeling of Stress : Not on file  Social Connections:   . Frequency of Communication with Friends and Family: Not on file  . Frequency of Social Gatherings with Friends and Family: Not on file  . Attends Religious Services: Not on file  . Active Member of Clubs or Organizations: Not on file  . Attends Archivist Meetings: Not on file  . Marital Status: Not on file  Intimate Partner Violence:   . Fear of Current or Ex-Partner: Not on file  . Emotionally Abused: Not on file  . Physically Abused: Not on file  . Sexually Abused: Not on file    Family History  Problem Relation Age of Onset  . Diabetes Mother   . Stroke Father  2  . Sleep apnea Father   . Diabetes Father   . Irritable bowel syndrome Brother   . Diabetes Maternal Grandmother   . Cancer Maternal Grandmother        died of breast cancer  . Heart disease Maternal Grandfather        died of MI  . Hyperlipidemia Paternal Grandmother   . Hypertension Paternal Grandfather   . Ehlers-Danlos syndrome Brother     No current outpatient medications on file.  Allergies  Allergen Reactions  . Poison Ivy Extract   . Prednisone     Irritable, bad reaction     Review of Systems Constitutional: -fever, -chills, -sweats, -unexpected weight change, -decreased appetite, +fatigue Allergy: -sneezing, -itching, -congestion Dermatology: -changing moles, --rash, -lumps ENT: -runny nose, -ear pain, -sore throat, -hoarseness, -sinus pain, -teeth pain, - ringing in ears, -hearing loss, -nosebleeds Cardiology: -chest wall  pain, -palpitations, -swelling, -difficulty breathing when lying flat, -waking up short of breath Respiratory: -cough, -shortness of breath, -difficulty breathing with exercise or exertion, -wheezing, -coughing up blood Gastroenterology: -abdominal pain, -nausea, -vomiting, -diarrhea, -constipation, -blood in stool, -changes in bowel movement, -difficulty swallowing or eating Hematology: -bleeding, -bruising  Musculoskeletal: -joint aches, -muscle aches, -joint swelling, -back pain, -neck pain, -cramping, -changes in gait Ophthalmology: denies vision changes, eye redness, itching, discharge Urology: -burning with urination, -difficulty urinating, -blood in urine, -urinary frequency, -urgency, -incontinence Neurology: -headache, -weakness, -tingling, -numbness, -memory loss, -falls, -dizziness Psychology: -depressed mood, -agitation, -sleep problems      Objective:   BP 110/70   Pulse 74   Temp 97.8 F (36.6 C)   Ht 5\' 9"  (1.753 m)   Wt 206 lb 6.4 oz (93.6 kg)   SpO2 96%   BMI 30.48 kg/m   General appearance: alert, no distress, WD/WN, Caucasian male Skin: no obvious worrisome lesions HEENT: normocephalic, conjunctiva/corneas normal, sclerae anicteric, PERRLA, EOMi, nares patent, no discharge or erythema, pharynx normal Oral cavity: MMM, tongue normal, teeth normal Neck: supple, no lymphadenopathy, no thyromegaly, no masses, normal ROM, no bruits Chest: right lower chest with horizontal surgical scar and keloid across chest as well as right axillary linear horizontal surgical scar, otherwise non tender, normal shape and expansion Heart: RRR, normal S1, S2, no murmurs Lungs: CTA bilaterally, no wheezes, rhonchi, or rales Abdomen: +bs, soft, non tender, non distended, no masses, no hepatomegaly, no splenomegaly, no bruits Back: non tender, normal ROM, no scoliosis Musculoskeletal: upper extremities non tender, no obvious deformity, normal ROM throughout, lower extremities non tender,  no obvious deformity, normal ROM throughout Extremities: no edema, no cyanosis, no clubbing Pulses: 2+ symmetric, upper and lower extremities, normal cap refill Neurological: alert, oriented x 3, CN2-12 intact, strength normal upper extremities and lower extremities, sensation normal throughout, DTRs 2+ throughout, no cerebellar signs, gait normal Psychiatric: normal affect, behavior normal, pleasant  GU: normal male external genitalia,uncircumcised, non tender, no masses, no hernia, no lymphadenopathy Rectal: deferred     Assessment and Plan :    Encounter Diagnoses  Name Primary?  . Encounter for health maintenance examination in adult Yes  . Mixed dyslipidemia   . History of migraine   . Influenza vaccination declined   . Generalized anxiety disorder   . History of melanoma   . Screen for STD (sexually transmitted disease)   . Sleep difficulties   . BMI 30.0-30.9,adult     Physical exam - discussed and counseled on healthy lifestyle, diet, exercise, preventative care, vaccinations, sick and well care, proper use of  emergency dept and after hours care, and addressed their concerns.    Health screening: See your eye doctor yearly for routine vision care. See your dentist yearly for routine dental care including hygiene visits twice yearly.  Cancer screening Regular skin surveillance, dermatology f/u.  Will request records from [facility] for 2020.  Vaccinations: Up to date on Td vaccine Declines flu shot  Acute issues discussed: none  Separate significant chronic issues discussed  Prior HTN, but normal readings.    Hx/o migraines - avoid triggers, no major headaches of late.  Abnormal sleep study 2020.  He couldn't afford CPAP.  We discussed considering CPAP, working on lifestyle changes and weight loss.    Julious was seen today for annual exam.  Diagnoses and all orders for this visit:  Encounter for health maintenance examination in adult -      Comprehensive metabolic panel -     CBC with Differential -     Lipid Panel -     HIV regular -     RPR regular -     GC/Chlamydia Probe Amp -     Hepatitis C antibody -     Hepatitis B surface antigen  Mixed dyslipidemia -     Lipid Panel  History of migraine  Influenza vaccination declined  Generalized anxiety disorder  History of melanoma  Screen for STD (sexually transmitted disease) -     HIV regular -     RPR regular -     GC/Chlamydia Probe Amp -     Hepatitis C antibody -     Hepatitis B surface antigen  Sleep difficulties  BMI 30.0-30.9,adult   Follow-up pending labs, yearly for physical

## 2019-10-05 LAB — CBC WITH DIFFERENTIAL/PLATELET
Basophils Absolute: 0.1 10*3/uL (ref 0.0–0.2)
Basos: 1 %
EOS (ABSOLUTE): 0.1 10*3/uL (ref 0.0–0.4)
Eos: 1 %
Hematocrit: 48 % (ref 37.5–51.0)
Hemoglobin: 16.1 g/dL (ref 13.0–17.7)
Immature Grans (Abs): 0.1 10*3/uL (ref 0.0–0.1)
Immature Granulocytes: 1 %
Lymphocytes Absolute: 2.4 10*3/uL (ref 0.7–3.1)
Lymphs: 35 %
MCH: 29.4 pg (ref 26.6–33.0)
MCHC: 33.5 g/dL (ref 31.5–35.7)
MCV: 88 fL (ref 79–97)
Monocytes Absolute: 0.5 10*3/uL (ref 0.1–0.9)
Monocytes: 8 %
Neutrophils Absolute: 3.7 10*3/uL (ref 1.4–7.0)
Neutrophils: 54 %
Platelets: 320 10*3/uL (ref 150–450)
RBC: 5.48 x10E6/uL (ref 4.14–5.80)
RDW: 12 % (ref 11.6–15.4)
WBC: 6.8 10*3/uL (ref 3.4–10.8)

## 2019-10-05 LAB — LIPID PANEL
Chol/HDL Ratio: 5.8 ratio — ABNORMAL HIGH (ref 0.0–5.0)
Cholesterol, Total: 230 mg/dL — ABNORMAL HIGH (ref 100–199)
HDL: 40 mg/dL (ref >39)
LDL Chol Calc (NIH): 147 mg/dL — ABNORMAL HIGH (ref 0–99)
Triglycerides: 234 mg/dL — ABNORMAL HIGH (ref 0–149)
VLDL Cholesterol Cal: 43 mg/dL — ABNORMAL HIGH (ref 5–40)

## 2019-10-05 LAB — GC/CHLAMYDIA PROBE AMP
Chlamydia trachomatis, NAA: NEGATIVE
Neisseria Gonorrhoeae by PCR: NEGATIVE

## 2019-10-05 LAB — COMPREHENSIVE METABOLIC PANEL
ALT: 15 IU/L (ref 0–44)
AST: 14 IU/L (ref 0–40)
Albumin/Globulin Ratio: 2.4 — ABNORMAL HIGH (ref 1.2–2.2)
Albumin: 4.7 g/dL (ref 4.0–5.0)
Alkaline Phosphatase: 67 IU/L (ref 39–117)
BUN/Creatinine Ratio: 9 (ref 9–20)
BUN: 11 mg/dL (ref 6–20)
Bilirubin Total: 0.4 mg/dL (ref 0.0–1.2)
CO2: 26 mmol/L (ref 20–29)
Calcium: 10.2 mg/dL (ref 8.7–10.2)
Chloride: 100 mmol/L (ref 96–106)
Creatinine, Ser: 1.24 mg/dL (ref 0.76–1.27)
GFR calc Af Amer: 88 mL/min/{1.73_m2} (ref >59)
GFR calc non Af Amer: 76 mL/min/{1.73_m2} (ref >59)
Globulin, Total: 2 g/dL (ref 1.5–4.5)
Glucose: 110 mg/dL — ABNORMAL HIGH (ref 65–99)
Potassium: 4.7 mmol/L (ref 3.5–5.2)
Sodium: 139 mmol/L (ref 134–144)
Total Protein: 6.7 g/dL (ref 6.0–8.5)

## 2019-10-05 LAB — HEPATITIS C ANTIBODY: Hep C Virus Ab: <0.1 s/co ratio (ref 0.0–0.9)

## 2019-10-05 LAB — RPR: RPR Ser Ql: NONREACTIVE

## 2019-10-05 LAB — HEPATITIS B SURFACE ANTIGEN: Hepatitis B Surface Ag: NEGATIVE

## 2019-10-05 LAB — HIV ANTIBODY (ROUTINE TESTING W REFLEX): HIV Screen 4th Generation wRfx: NONREACTIVE

## 2019-12-13 DIAGNOSIS — D225 Melanocytic nevi of trunk: Secondary | ICD-10-CM | POA: Diagnosis not present

## 2019-12-13 DIAGNOSIS — D1801 Hemangioma of skin and subcutaneous tissue: Secondary | ICD-10-CM | POA: Diagnosis not present

## 2019-12-13 DIAGNOSIS — L905 Scar conditions and fibrosis of skin: Secondary | ICD-10-CM | POA: Diagnosis not present

## 2019-12-13 DIAGNOSIS — L821 Other seborrheic keratosis: Secondary | ICD-10-CM | POA: Diagnosis not present

## 2020-01-10 ENCOUNTER — Telehealth: Payer: Self-pay | Admitting: Medical

## 2020-01-10 NOTE — Telephone Encounter (Signed)
Received requested records from [facility]  

## 2020-09-05 ENCOUNTER — Telehealth: Payer: PRIVATE HEALTH INSURANCE | Admitting: Medical

## 2020-09-05 ENCOUNTER — Ambulatory Visit (HOSPITAL_COMMUNITY)
Admission: RE | Admit: 2020-09-05 | Discharge: 2020-09-05 | Disposition: A | Discharge status: Home / Self Care | Payer: No Typology Code available for payment source | Source: Ambulatory Visit | Attending: Medical | Admitting: Medical

## 2020-09-05 ENCOUNTER — Other Ambulatory Visit: Payer: Self-pay

## 2020-09-05 ENCOUNTER — Encounter: Payer: Self-pay | Admitting: Medical

## 2020-09-05 VITALS — Temp 100.0°F | Ht 69.0 in | Wt 210.0 lb

## 2020-09-05 DIAGNOSIS — R509 Fever, unspecified: Secondary | ICD-10-CM | POA: Diagnosis not present

## 2020-09-05 DIAGNOSIS — U071 COVID-19: Secondary | ICD-10-CM | POA: Insufficient documentation

## 2020-09-05 DIAGNOSIS — R059 Cough, unspecified: Secondary | ICD-10-CM | POA: Diagnosis not present

## 2020-09-05 MED ORDER — ALBUTEROL SULFATE HFA 108 (90 BASE) MCG/ACT IN AERS: 2.0000 | INHALATION_SPRAY | Freq: Four times a day (QID) | RESPIRATORY_TRACT | 0 refills | Status: DC | PRN

## 2020-09-05 NOTE — Progress Notes (Signed)
Subjective:     Patient ID: Mark Dunn, male   DOB: Oct 27, 1985, 34 y.o.   MRN: [id number]  This visit type was conducted due to national recommendations for restrictions regarding the COVID-19 Pandemic (e.g. social distancing) in an effort to limit this patient's exposure and mitigate transmission in our community.  Due to their co-morbid illnesses, this patient is at least at moderate risk for complications without adequate follow up.  This format is felt to be most appropriate for this patient at this time.    Documentation for virtual audio and video telecommunications through [place] encounter:  The patient was located at home. The provider was located in the office. The patient did consent to this visit and is aware of possible charges through their insurance for this visit.  The other persons participating in this telemedicine service were none. Time spent on call was 20 minutes and in review of previous records 20 minutes total.  This virtual service is not related to other E/M service within previous 7 days.   HPI Chief Complaint  Patient presents with  . Follow-up    +covid and not getting better   . Cough    with fever 99-101 and chest hurt when cough   . Fatigue    Virtual consult today for respiratory tract infection, covid +.  Symptoms started on November 28 but he tested positive for Covid last Tuesday, November 30.  Current symptoms include stuffy nose, soreness in the chest, fatigue, cough but cough worsens if moving around.  His cough is better if rested.  No change in taste or smell.  Fevers have been up and down since a week ago.  Nausea but no vomiting.  Has had some loose stools.  He notes stuffy nose, soreness in the chest.  No headache.  Fevers have persisted.  Sometimes short of breath.  Wife also is sick but she is recovering and improving.  Using Mucinex, ibuprofen, allergy medicine, and hydration.  Does not feel dehydrated.  No other aggravating or relieving  factors. No other complaint.  Past Medical History:  Diagnosis Date  . History of migraine    worse in the past during [place], triggered prior by sugar intake  . Hyperlipidemia   . Hypertension 2016  . Melanoma ([place]) 2019   [facility] in [place], [place].  [facility].  . Mood changes    prior intermittent episodes of depressed mood?  as of 06/2017  . Wears contact lenses      Review of Systems As in subjective    Objective:   Physical Exam Due to coronavirus pandemic stay at home measures, patient visit was virtual and they were not examined in person.   Temp 100 F (37.8 C)   Ht 5\' 9"  (1.753 m)   Wt 210 lb (95.3 kg)   BMI 31.01 kg/m       Assessment:     Encounter Diagnoses  Name Primary?  . Cough Yes  . COVID-19   . Fever, unspecified fever cause        Plan:     We discussed his recent Covid positive test, symptoms and concerns.  He is beyond the window of time for monoclonal antibody treatment.  We discussed using over-the-counter emergency immune plus vitamin support, continue to rest and hydrate and avoid dehydration.  Begin albuterol to help with shortness of breath and coughing spells.  Overall the over-the-counter cough medicine is working okay for him.  Advised chest x-ray for further  evaluation.  We discussed other possible complications and long-haul symptoms.  We discussed that it can take several weeks to fully week however and get back to normal energy levels.  If any new symptoms, worse symptoms, or suspected complications over the next few weeks, then recheck.   Overall he seems to be improved except for the fatigue.  Evart was seen today for follow-up, cough and fatigue.  Diagnoses and all orders for this visit:  Cough -     DG Chest 2 View  COVID-19 -     DG Chest 2 View  Fever, unspecified fever cause -     DG Chest 2 View  Other orders -     albuterol (VENTOLIN HFA) 108 (90 Base) MCG/ACT inhaler; Inhale 2 puffs into  the lungs every 6 (six) hours as needed for wheezing or shortness of breath.  f/u pending chest x-ray

## 2020-09-05 NOTE — Progress Notes (Signed)
done

## 2020-10-09 ENCOUNTER — Encounter: Payer: BC Managed Care – PPO | Admitting: Medical

## 2020-10-10 ENCOUNTER — Encounter: Payer: Self-pay | Admitting: Medical

## 2022-06-05 ENCOUNTER — Encounter: Payer: Self-pay | Admitting: Internal Medicine

## 2022-07-09 ENCOUNTER — Encounter: Payer: Self-pay | Admitting: Internal Medicine

## 2023-06-27 ENCOUNTER — Ambulatory Visit: Payer: No Typology Code available for payment source | Admitting: Medical

## 2023-06-27 ENCOUNTER — Encounter: Payer: Self-pay | Admitting: Medical

## 2023-06-27 VITALS — BP 122/74 | HR 68 | Wt 216.0 lb

## 2023-06-27 DIAGNOSIS — B029 Zoster without complications: Secondary | ICD-10-CM

## 2023-06-27 MED ORDER — VALACYCLOVIR HCL 1 G PO TABS: 1000.0000 mg | ORAL_TABLET | Freq: Three times a day (TID) | ORAL | 0 refills | Status: AC

## 2023-06-27 MED ORDER — OXYCODONE-ACETAMINOPHEN 5-325 MG PO TABS: 1.0000 | ORAL_TABLET | Freq: Three times a day (TID) | ORAL | 0 refills | Status: AC | PRN

## 2023-06-27 MED ORDER — TRIAMCINOLONE ACETONIDE 0.1 % EX CREA: 1.0000 | TOPICAL_CREAM | Freq: Two times a day (BID) | CUTANEOUS | 0 refills | Status: AC

## 2023-06-27 NOTE — Progress Notes (Signed)
Subjective:  Mark Dunn is a 37 y.o. male who presents for Chief Complaint  Patient presents with   other     Started Monday night, possible Shingles started in the rt. Back moved around to his chest now, pain, itching, burning,      Here with wife for possible shingles.  He started feeling a bruise-like sensation in his upper back 5 days ago.  He ended up seeing his dermatologist the next day and had a small bit of a rash on the back.  But it was not really developed or bigger.  Within the next day and a half he had a full-fledged rash breakout across his back and chest with blisters.  It feels like a burning sensation and very painful.  Pain up to 8 out of 10 pain.  Been using some lotion/ointment on the rash.  They are worried about shingles.  No prior shingles. No other aggravating or relieving factors.    No other c/o.  The following portions of the patient's history were reviewed and updated as appropriate: allergies, current medications, past family history, past medical history, past social history, past surgical history and problem list.  ROS Otherwise as in subjective above  Objective: BP 122/74   Pulse 68   Wt 216 lb (98 kg)   BMI 31.90 kg/m   General appearance: alert, no distress, well developed, well nourished Diaphoretic Skin: There is a large developed rash of erythema along the T4-T5 dermatome anterior and posterior chest with vesicular lesions throughout quite consistent with shingles rash    Assessment: Encounter Diagnosis  Name Primary?   Herpes zoster without complication Yes     Plan: Discussed the symptoms, exam findings, suggestive of shingles outbreak.  discussed transmission, precautions to prevent spread to others, particularly high risk groups as discussed including young children, elderly, immunocompromised people, or pregnant women.    Discussed treatment including medications below, relative rest, hydration, can use the cream below topically but  don't let others use the cream and discard the cream after this episode of shingles.     Begin Valtrex as below, can use NSAID OTC 800mg , and can use Percocet below for worse pain sparingly.   Discussed the possibility of post herpetic neuralgia.   Answered their questions, After visit summary given.   Mark Dunn was seen today for other.  Diagnoses and all orders for this visit:  Herpes zoster without complication  Other orders -     valACYclovir (VALTREX) 1000 MG tablet; Take 1 tablet (1,000 mg total) by mouth 3 (three) times daily for 7 days. -     triamcinolone cream (KENALOG) 0.1 %; Apply 1 Application topically 2 (two) times daily. -     oxyCODONE-acetaminophen (PERCOCET/ROXICET) 5-325 MG tablet; Take 1 tablet by mouth every 8 (eight) hours as needed for up to 5 days for severe pain.    Follow up: prn

## 2023-07-01 ENCOUNTER — Ambulatory Visit: Payer: No Typology Code available for payment source | Admitting: Medical

## 2023-07-01 VITALS — BP 120/78 | HR 77 | Ht 70.0 in | Wt 213.0 lb

## 2023-07-01 DIAGNOSIS — Z8669 Personal history of other diseases of the nervous system and sense organs: Secondary | ICD-10-CM

## 2023-07-01 DIAGNOSIS — R911 Solitary pulmonary nodule: Secondary | ICD-10-CM | POA: Diagnosis not present

## 2023-07-01 DIAGNOSIS — Z9289 Personal history of other medical treatment: Secondary | ICD-10-CM | POA: Insufficient documentation

## 2023-07-01 DIAGNOSIS — Z0000 Encounter for general adult medical examination without abnormal findings: Secondary | ICD-10-CM | POA: Diagnosis not present

## 2023-07-01 DIAGNOSIS — Z85820 Personal history of malignant melanoma of skin: Secondary | ICD-10-CM

## 2023-07-01 DIAGNOSIS — Z7185 Encounter for immunization safety counseling: Secondary | ICD-10-CM | POA: Insufficient documentation

## 2023-07-01 DIAGNOSIS — Z113 Encounter for screening for infections with a predominantly sexual mode of transmission: Secondary | ICD-10-CM

## 2023-07-01 DIAGNOSIS — Z136 Encounter for screening for cardiovascular disorders: Secondary | ICD-10-CM | POA: Diagnosis not present

## 2023-07-01 DIAGNOSIS — E782 Mixed hyperlipidemia: Secondary | ICD-10-CM

## 2023-07-01 DIAGNOSIS — Z131 Encounter for screening for diabetes mellitus: Secondary | ICD-10-CM | POA: Insufficient documentation

## 2023-07-01 DIAGNOSIS — Z6830 Body mass index (BMI) 30.0-30.9, adult: Secondary | ICD-10-CM

## 2023-07-01 DIAGNOSIS — Z8619 Personal history of other infectious and parasitic diseases: Secondary | ICD-10-CM | POA: Insufficient documentation

## 2023-07-01 DIAGNOSIS — Z2821 Immunization not carried out because of patient refusal: Secondary | ICD-10-CM

## 2023-07-01 LAB — POCT URINALYSIS DIP (PROADVANTAGE DEVICE)
Bilirubin, UA: NEGATIVE
Blood, UA: NEGATIVE
Glucose, UA: NEGATIVE mg/dL
Ketones, POC UA: NEGATIVE mg/dL
Leukocytes, UA: NEGATIVE
Nitrite, UA: NEGATIVE
Protein Ur, POC: NEGATIVE mg/dL
Specific Gravity, Urine: 1.01
Urobilinogen, Ur: 0.2
pH, UA: 6 (ref 5.0–8.0)

## 2023-07-01 NOTE — Progress Notes (Signed)
Subjective:   HPI  Mark Dunn is a 37 y.o. male who presents for Chief Complaint  Patient presents with   Annual Exam    Fasting annual exam. Would like STD check. One of his partners HPV 16 positive. Would like to start HPV series today. Declined flu and covid.     Patient Care Team: Rivers Hamrick, Cleda Mccreedy as PCP - General [facility]) Dermatology Dentist   Concerns: Here for well visit.  I saw him a few days ago for shingles infection.  He is taking the Valtrex and pain medicine.  He is slowly starting to get some scabbing of the rash.  He has a history of melanoma, been in remission for 5 years.  He now just changed to yearly follow-up with dermatology instead of every 6 months.  Otherwise normal state of health.   Reviewed their medical, surgical, family, social, medication, and allergy history and updated chart as appropriate.  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  [facility].   Mood changes    prior intermittent episodes of depressed mood?  as of 06/2017   Wears contact lenses     Current Outpatient Medications on File Prior to Visit  Medication Sig Dispense Refill   ibuprofen (ADVIL) 200 MG tablet Take 200 mg by mouth every 6 (six) hours as needed. 800 mg daily     oxyCODONE-acetaminophen (PERCOCET/ROXICET) 5-325 MG tablet Take 1 tablet by mouth every 8 (eight) hours as needed for up to 5 days for severe pain. 12 tablet 0   triamcinolone cream (KENALOG) 0.1 % Apply 1 Application topically 2 (two) times daily. 30 g 0   valACYclovir (VALTREX) 1000 MG tablet Take 1 tablet (1,000 mg total) by mouth 3 (three) times daily for 7 days. 21 tablet 0   No current  facility-administered medications on file prior to visit.      Current Outpatient Medications:    ibuprofen (ADVIL) 200 MG tablet, Take 200 mg by mouth every 6 (six) hours as needed. 800 mg daily, Disp: , Rfl:    oxyCODONE-acetaminophen (PERCOCET/ROXICET) 5-325 MG tablet, Take 1 tablet by mouth every 8 (eight) hours as needed for up to 5 days for severe pain., Disp: 12 tablet, Rfl: 0   triamcinolone cream (KENALOG) 0.1 %, Apply 1 Application topically 2 (two) times daily., Disp: 30 g, Rfl: 0   valACYclovir (VALTREX) 1000 MG tablet, Take 1 tablet (1,000 mg total) by mouth 3 (three) times daily for 7 days., Disp: 21 tablet, Rfl: 0  Family History  Problem Relation Age of Onset   Diabetes Mother    Stroke Father        2   Sleep apnea Father    Diabetes Father    Diabetes Brother        prediabetic   Irritable bowel syndrome Brother    Ehlers-Danlos syndrome Brother    Diabetes Maternal Grandmother    Cancer Maternal Grandmother        died of breast cancer   Heart disease Maternal Grandfather        died of MI   Hyperlipidemia Paternal Grandmother  Heart disease Paternal Grandfather        CABG, MI   Hypertension Paternal Grandfather     Past Surgical History:  Procedure Laterality Date   SKIN SURGERY  2019    Review of Systems  Constitutional:  Negative for chills, fever, malaise/fatigue and weight loss.  HENT:  Negative for congestion, ear pain, hearing loss, sore throat and tinnitus.   Eyes:  Negative for blurred vision, pain and redness.  Respiratory:  Negative for cough, hemoptysis and shortness of breath.   Cardiovascular:  Negative for chest pain, palpitations, orthopnea, claudication and leg swelling.  Gastrointestinal:  Negative for abdominal pain, blood in stool, constipation, diarrhea, nausea and vomiting.  Genitourinary:  Negative for dysuria, flank pain, frequency, hematuria and urgency.  Musculoskeletal:  Negative for falls, joint pain and myalgias.  Skin:   Positive for rash. Negative for itching.  Neurological:  Negative for dizziness, tingling, speech change, weakness and headaches.  Endo/Heme/Allergies:  Negative for polydipsia. Does not bruise/bleed easily.  Psychiatric/Behavioral:  Negative for depression and memory loss. The patient is not nervous/anxious and does not have insomnia.        Objective:  BP 120/78   Pulse 77   Ht 5\' 10"  (1.778 m)   Wt 213 lb (96.6 kg)   SpO2 97%   BMI 30.56 kg/m   General appearance: alert, no distress, WD/WN, Caucasian male Skin: large patches of erythema with vesicular lesions and some early scabbing along right upper chest wall T4-5 region c/w shingles HEENT: normocephalic, conjunctiva/corneas normal, sclerae anicteric, PERRLA, EOMi, nares patent, no discharge or erythema, pharynx normal Oral cavity: MMM, tongue normal, teeth normal Neck: supple, no lymphadenopathy, no thyromegaly, no masses, normal ROM, no bruits Chest: non tender, normal shape and expansion Heart: RRR, normal S1, S2, no murmurs Lungs: CTA bilaterally, no wheezes, rhonchi, or rales Abdomen: +bs, soft, non tender, non distended, no masses, no hepatomegaly, no splenomegaly, no bruits Back: non tender, normal ROM, no scoliosis Musculoskeletal: upper extremities non tender, no obvious deformity, normal ROM throughout, lower extremities non tender, no obvious deformity, normal ROM throughout Extremities: no edema, no cyanosis, no clubbing Pulses: 2+ symmetric, upper and lower extremities, normal cap refill Neurological: alert, oriented x 3, CN2-12 intact, strength normal upper extremities and lower extremities, sensation normal throughout, DTRs 2+ throughout, no cerebellar signs, gait normal Psychiatric: normal affect, behavior normal, pleasant  GU: deferred/declined Rectal: deferred    Assessment and Plan :   Encounter Diagnoses  Name Primary?   Encounter for health maintenance examination in adult Yes   Pulmonary nodule     Screening for heart disease    Screen for STD (sexually transmitted disease)    BMI 30.0-30.9,adult    History of melanoma    History of migraine    Influenza vaccination declined    Mixed dyslipidemia    Vaccine counseling    History of shingles    H/O sleep study    Screening for diabetes mellitus     This visit was a preventative care visit, also known as wellness visit or routine physical.   Topics typically include healthy lifestyle, diet, exercise, preventative care, vaccinations, sick and well care, proper use of emergency dept and after hours care, as well as other concerns.     Separate significant issues discussed: Looking back through prior records, he had a pulmonary nodule noted on CT chest in 2020.  We will update a CT chest for both pulmonary nodule follow-up and coronary artery disease screening  History of sleep apnea, not currently on CPAP.  Counseled on potential treatments.  He does not want to use CPAP.  He does have a electric bed that he can raise of the head of the bed.  He does sleep inclined.  Wife denies any snoring and mild.  Discussed the need to lose weight through healthy diet and exercise.  Shingles infection-slowly starting to see some improvements.  Continue and finish out Valtrex, pain medicine as needed  Prior dyslipidemia-updated labs today  History of melanoma-continue regular follow-up with dermatology  We discussed STD prevention, consider Truvada prep, consider labs today   General Recommendations: Continue to return yearly for your annual wellness and preventative care visits.  This gives [place] a chance to discuss healthy lifestyle, exercise, vaccinations, review your chart record, and perform screenings where appropriate.  I recommend you see your eye doctor yearly for routine vision care.  I recommend you see your dentist yearly for routine dental care including hygiene visits twice yearly.   Vaccination  Immunization History   Administered Date(s) Administered   Tdap 09/29/2018    Vaccine recommendations: He will return to start HPV vaccine series once the shingles infection has calmed down   Screening for cancer: Colon cancer screening: Age 57  Testicular cancer screening You should do a monthly self testicular exam if you are between 68-27 years old, and we typically do a testicular exam on the yearly physical for this same age group.   Prostate Cancer screening: The recommended prostate cancer screening test is a blood test called the prostate-specific antigen (PSA) test. PSA is a protein that is made in the prostate. As you age, your prostate naturally produces more PSA. Abnormally high PSA levels may be caused by: Prostate cancer. An enlarged prostate that is not caused by cancer (benign prostatic hyperplasia, or BPH). This condition is very common in older men. A prostate gland infection (prostatitis) or urinary tract infection. Certain medicines such as male hormones (like testosterone) or other medicines that raise testosterone levels. A rectal exam may be done as part of prostate cancer screening to help provide information about the size of your prostate gland. When a rectal exam is performed, it should be done after the PSA level is drawn to avoid any effect on the results.   Skin cancer screening: Check your skin regularly for new changes, growing lesions, or other lesions of concern Come in for evaluation if you have skin lesions of concern.   Lung cancer screening: If you have a greater than 20 pack year history of tobacco use, then you may qualify for lung cancer screening with a chest CT scan.   Please call your insurance company to inquire about coverage for this test.   Pancreatic cancer:  no current screening test is available or routinely recommended. (risk factors: smoking, overweight or obese, diabetes, chronic pancreatitis, work exposure - dry cleaning, metal working, 37yo>, M>F,  Tree surgeon, family hx/o, hereditary breast, ovarian, melanoma, lynch, peutz-jeghers).  Symptoms: jaundice, dark urine, light color or greasy stools, itchy skin, belly or back pain, weight loss, poor appetite, nausea, vomiting, liver enlargement, DVT/blood clots.   We currently don't have screenings for other cancers besides breast, cervical, colon, and lung cancers.  If you have a strong family history of cancer or have other cancer screening concerns, please let me know.  Genetic testing referral is an option for individuals with high cancer risk in the family.  There are some other cancer screenings in development currently.  Bone health: Get at least 150 minutes of aerobic exercise weekly Get weight bearing exercise at least once weekly Bone density test:  A bone density test is an imaging test that uses a type of X-ray to measure the amount of calcium and other minerals in your bones. The test may be used to diagnose or screen you for a condition that causes weak or thin bones (osteoporosis), predict your risk for a broken bone (fracture), or determine how well your osteoporosis treatment is working. The bone density test is recommended for females 65 and older, or females or males <65 if certain risk factors such as thyroid disease, long term use of steroids such as for asthma or rheumatological issues, vitamin D deficiency, estrogen deficiency, family history of osteoporosis, self or family history of fragility fracture in first degree relative.    Heart health: Get at least 150 minutes of aerobic exercise weekly Limit alcohol It is important to maintain a healthy blood pressure and healthy cholesterol numbers  Heart disease screening: Screening for heart disease includes screening for blood pressure, fasting lipids, glucose/diabetes screening, BMI height to weight ratio, reviewed of smoking status, physical activity, and diet.    Goals include blood pressure 120/80 or less,  maintaining a healthy lipid/cholesterol profile, preventing diabetes or keeping diabetes numbers under good control, not smoking or using tobacco products, exercising most days per week or at least 150 minutes per week of exercise, and eating healthy variety of fruits and vegetables, healthy oils, and avoiding unhealthy food choices like fried food, fast food, high sugar and high cholesterol foods.    Other tests may possibly include EKG test, CT coronary calcium score, echocardiogram, exercise treadmill stress test.     Heart Disease Testing completed: Referral for CT chest   Vascular disease screening: For higher risk individuals including smokers, diabetics, patients with known heart disease or high blood pressure, kidney disease, and others, screening for vascular disease or atherosclerosis of the arteries is available.  Examples may include carotid ultrasound, abdominal aortic ultrasound, ABI blood flow screening in the legs, thoracic aorta screening.   Medical care options: I recommend you continue to seek care here first for routine care.  We try really hard to have available appointments Monday through Friday daytime hours for sick visits, acute visits, and physicals.  Urgent care should be used for after hours and weekends for significant issues that cannot wait till the next day.  The emergency department should be used for significant potentially life-threatening emergencies.  The emergency department is expensive, can often have long wait times for less significant concerns, so try to utilize primary care, urgent care, or telemedicine when possible to avoid unnecessary trips to the emergency department.  Virtual visits and telemedicine have been introduced since the pandemic started in 2020, and can be convenient ways to receive medical care.  We offer virtual appointments as well to assist you in a variety of options to seek medical care.   Legal  Take the time to do a last will and  testament, Advanced Directives including Health Care Power of Attorney and Living Will documents.  Don't leave your family with burdens that can be handled ahead of time.   Advanced Directives: I recommend you consider completing a Health Care Power of Attorney and Living Will.   These documents respect your wishes and help alleviate burdens on your loved ones if you were to become terminally ill or be in a position to need those documents enforced.  You can complete Advanced Directives yourself, have them notarized, then have copies made for our office, for you and for anybody you feel should have them in safe keeping.  Or, you can have an attorney prepare these documents.   If you haven't updated your Last Will and Testament in a while, it may be worthwhile having an attorney prepare these documents together and save on some costs.       Spiritual and Emotional Health Keeping a healthy spiritual life can help you better manage your physical health. Your spiritual life can help you to cope with any issues that may arise with your physical health.  Balance can keep [place] healthy and help [place] to recover.  If you are struggling with your spiritual health there are questions that you may want to ask yourself:  What makes me feel most complete? When do I feel most connected to the rest of the world? Where do I find the most inner strength? What am I doing when I feel whole?  Helpful tips: Being in nature. Some people feel very connected and at peace when they are walking outdoors or are outside. Helping others. Some feel the largest sense of wellbeing when they are of service to others. Being of service can take on many forms. It can be doing volunteer work, being kind to strangers, or offering a hand to a friend in need. Gratitude. Some people find they feel the most connected when they remain grateful. They may make lists of all the things they are grateful for or say a thank you out loud for all  they have.    Emotional Health Are you in tune with your emotional health?  Check out this link: http://www.marquez-love.com/    Financial Health Make sure you use a budget for your personal finances Make sure you are insured against risks (health insurance, life insurance, auto insurance, etc) Save more, spend less Set financial goals If you need help in this area, good resources include counseling through [employer] or other community resources, have a meeting with a Social research officer, government, and a good resource is the [employer] was seen today for annual exam.  Diagnoses and all orders for this visit:  Encounter for health maintenance examination in adult -     CT CHEST NODULE FOLLOW UP LOW DOSE W/O; Future -     Comprehensive metabolic panel -     CBC with Differential/Platelet -     Lipid panel -     HIV Antibody (routine testing w rflx) -     RPR -     Chlamydia/Gonococcus/Trichomonas, NAA -     Hepatitis C antibody -     Hepatitis B surface antigen -     Hemoglobin A1c  Pulmonary nodule -     CT CHEST NODULE FOLLOW UP LOW DOSE W/O; Future  Screening for heart disease -     CT CHEST NODULE FOLLOW UP LOW DOSE W/O; Future  Screen for STD (sexually transmitted disease) -     HIV Antibody (routine testing w rflx) -     RPR -     Chlamydia/Gonococcus/Trichomonas, NAA -     Hepatitis C antibody -     Hepatitis B surface antigen  BMI 30.0-30.9,adult  History of melanoma  History of migraine  Influenza vaccination declined  Mixed dyslipidemia -     Lipid panel  Vaccine counseling  History of shingles  H/O sleep study  Screening  for diabetes mellitus -     Hemoglobin A1c    Follow-up pending labs, yearly for physical

## 2023-07-01 NOTE — Addendum Note (Signed)
Addended by: Debbrah Alar F on: 07/01/2023 11:14 AM   Modules accepted: Orders

## 2023-07-02 LAB — HEPATITIS C ANTIBODY

## 2023-07-02 LAB — CBC WITH DIFFERENTIAL/PLATELET
Basophils Absolute: 0.1 10*3/uL (ref 0.0–0.2)
Basos: 1 %
EOS (ABSOLUTE): 0.1 10*3/uL (ref 0.0–0.4)
Eos: 1 %
Hematocrit: 44.9 % (ref 37.5–51.0)
Hemoglobin: 14.6 g/dL (ref 13.0–17.7)
Immature Grans (Abs): 0.1 10*3/uL (ref 0.0–0.1)
Immature Granulocytes: 1 %
Lymphocytes Absolute: 2.9 10*3/uL (ref 0.7–3.1)
Lymphs: 35 %
MCH: 29.1 pg (ref 26.6–33.0)
MCHC: 32.5 g/dL (ref 31.5–35.7)
MCV: 89 fL (ref 79–97)
Monocytes Absolute: 0.7 10*3/uL (ref 0.1–0.9)
Monocytes: 9 %
Neutrophils Absolute: 4.5 10*3/uL (ref 1.4–7.0)
Neutrophils: 53 %
Platelets: 298 10*3/uL (ref 150–450)
RBC: 5.02 x10E6/uL (ref 4.14–5.80)
RDW: 11.9 % (ref 11.6–15.4)
WBC: 8.2 10*3/uL (ref 3.4–10.8)

## 2023-07-02 LAB — LIPID PANEL
Chol/HDL Ratio: 6.8 {ratio} — ABNORMAL HIGH (ref 0.0–5.0)
Cholesterol, Total: 203 mg/dL — ABNORMAL HIGH (ref 100–199)
HDL: 30 mg/dL — ABNORMAL LOW (ref >39)
LDL Chol Calc (NIH): 131 mg/dL — ABNORMAL HIGH (ref 0–99)
Triglycerides: 231 mg/dL — ABNORMAL HIGH (ref 0–149)
VLDL Cholesterol Cal: 42 mg/dL — ABNORMAL HIGH (ref 5–40)

## 2023-07-02 LAB — COMPREHENSIVE METABOLIC PANEL
ALT: 24 [IU]/L (ref 0–44)
AST: 20 [IU]/L (ref 0–40)
Albumin: 4.5 g/dL (ref 4.1–5.1)
Alkaline Phosphatase: 66 [IU]/L (ref 44–121)
BUN/Creatinine Ratio: 13 (ref 9–20)
BUN: 16 mg/dL (ref 6–20)
Bilirubin Total: 0.5 mg/dL (ref 0.0–1.2)
CO2: 23 mmol/L (ref 20–29)
Calcium: 9.8 mg/dL (ref 8.7–10.2)
Chloride: 103 mmol/L (ref 96–106)
Creatinine, Ser: 1.26 mg/dL (ref 0.76–1.27)
Globulin, Total: 2.7 g/dL (ref 1.5–4.5)
Glucose: 92 mg/dL (ref 70–99)
Potassium: 4.5 mmol/L (ref 3.5–5.2)
Sodium: 142 mmol/L (ref 134–144)
Total Protein: 7.2 g/dL (ref 6.0–8.5)
eGFR: 75 mL/min/{1.73_m2} (ref >59)

## 2023-07-02 LAB — HEMOGLOBIN A1C
Est. average glucose Bld gHb Est-mCnc: 120 mg/dL
Hgb A1c MFr Bld: 5.8 % — ABNORMAL HIGH (ref 4.8–5.6)

## 2023-07-02 LAB — HEPATITIS B SURFACE ANTIGEN

## 2023-07-02 LAB — HIV ANTIBODY (ROUTINE TESTING W REFLEX)

## 2023-07-02 LAB — RPR

## 2023-07-02 NOTE — Progress Notes (Signed)
Results sent through MyChart

## 2023-07-03 LAB — CHLAMYDIA/GONOCOCCUS/TRICHOMONAS, NAA
Chlamydia by NAA: NEGATIVE
Gonococcus by NAA: NEGATIVE
Trich vag by NAA: NEGATIVE

## 2023-07-03 NOTE — Progress Notes (Signed)
Results sent through MyChart

## 2023-07-04 ENCOUNTER — Encounter: Payer: Self-pay | Admitting: Medical

## 2023-07-21 ENCOUNTER — Ambulatory Visit
Admission: RE | Admit: 2023-07-21 | Discharge: 2023-07-21 | Disposition: A | Discharge status: Home / Self Care | Payer: Self-pay | Source: Ambulatory Visit | Attending: Medical | Admitting: Medical

## 2023-07-21 DIAGNOSIS — Z136 Encounter for screening for cardiovascular disorders: Secondary | ICD-10-CM

## 2023-07-21 DIAGNOSIS — Z0000 Encounter for general adult medical examination without abnormal findings: Secondary | ICD-10-CM

## 2023-07-21 DIAGNOSIS — R911 Solitary pulmonary nodule: Secondary | ICD-10-CM

## 2023-08-14 NOTE — Progress Notes (Signed)
Results sent through MyChart

## 2023-11-10 ENCOUNTER — Encounter: Payer: No Typology Code available for payment source | Admitting: Medical

## 2024-07-05 ENCOUNTER — Ambulatory Visit: Payer: No Typology Code available for payment source | Admitting: Medical

## 2024-07-05 VITALS — BP 120/62 | HR 65 | Ht 69.5 in | Wt 214.8 lb

## 2024-07-05 DIAGNOSIS — Z0000 Encounter for general adult medical examination without abnormal findings: Secondary | ICD-10-CM | POA: Diagnosis not present

## 2024-07-05 DIAGNOSIS — Z7185 Encounter for immunization safety counseling: Secondary | ICD-10-CM

## 2024-07-05 DIAGNOSIS — Z131 Encounter for screening for diabetes mellitus: Secondary | ICD-10-CM

## 2024-07-05 DIAGNOSIS — Z23 Encounter for immunization: Secondary | ICD-10-CM

## 2024-07-05 DIAGNOSIS — E782 Mixed hyperlipidemia: Secondary | ICD-10-CM | POA: Diagnosis not present

## 2024-07-05 DIAGNOSIS — Z113 Encounter for screening for infections with a predominantly sexual mode of transmission: Secondary | ICD-10-CM | POA: Diagnosis not present

## 2024-07-05 DIAGNOSIS — I517 Cardiomegaly: Secondary | ICD-10-CM | POA: Insufficient documentation

## 2024-07-05 LAB — LIPID PANEL

## 2024-07-05 NOTE — Progress Notes (Signed)
 Name: Mark Dunn   Date of Visit: 07/05/24   Date of last visit with me: Visit date not found   CHIEF COMPLAINT:  Chief Complaint  Patient presents with   Annual Exam    Fasting cpe, no concerns,  needs HPV and Shingrix (would like vaccine since had shingles last year). Declines flu and covid       HPI:  Discussed the use of AI scribe software for clinical note transcription with the patient, who gave verbal consent to proceed.  History of Present Illness   Mark Dunn is a 38 year old male who presents for an annual physical exam.  He has a history of blood pressure issues. He is not on any daily medications and prefers to avoid them when possible. He does not routinely check his blood pressure at home since misplacing his machine after moving.  He has a history of high cholesterol, with consistently elevated triglycerides, LDL cholesterol over 130, and low HDL levels. He has a family history of high cholesterol and heart issues, including a grandfather with three heart stents. 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 His diet has improved with more home-cooked meals since moving in with another couple, although he still consumes fast food occasionally.  He recalls a chest CT from October of the previous year that noted a pulmonary nodule and a slightly enlarged heart. He has not had an echocardiogram despite a family history of Ehlers-Danlos Syndrome (EDS) in his brother, which prompted a recommendation for heart evaluation.  He has a history of shingles, which was severe at the time. He is up to date on tetanus vaccinations and is unsure if he received the HPV vaccine during childhood.  He has not seen a dentist this year but visits an eye doctor annually.      Reviewed their medical, surgical, family, social, medication, and allergy history and updated chart as appropriate.  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  [facility].   Mood changes    prior intermittent episodes of depressed mood?  as of 06/2017   Wears contact lenses      Current Outpatient Medications:    ibuprofen (ADVIL) 200 MG tablet, Take 200 mg by mouth every 6 (six) hours as needed. 800 mg daily, Disp: , Rfl:    triamcinolone  cream (KENALOG ) 0.1 %, Apply 1 Application topically 2 (two) times daily., Disp: 30 g, Rfl: 0  Family History  Problem Relation Age of Onset   Diabetes Mother    Stroke Father        2   Sleep apnea Father    Diabetes Father    Diabetes Brother        prediabetic   Irritable bowel syndrome Brother    Ehlers-Danlos syndrome Brother    Diabetes Maternal Grandmother    Cancer Maternal Grandmother        died of breast cancer   Heart disease Maternal Grandfather        died of MI   Hyperlipidemia Paternal Grandmother    Heart disease Paternal Grandfather  CABG, MI   Hypertension Paternal Grandfather     Past Surgical History:  Procedure Laterality Date   SKIN SURGERY  2019     Review of Systems  Constitutional:  Negative for chills, fever, malaise/fatigue and weight loss.  HENT:  Negative for congestion, ear pain, hearing loss, sore throat and tinnitus.   Eyes:  Negative for blurred vision, pain and redness.  Respiratory:  Negative for cough, hemoptysis and shortness of breath.   Cardiovascular:  Negative for chest pain, palpitations, orthopnea, claudication and leg swelling.  Gastrointestinal:  Negative for abdominal pain, blood in stool, constipation, diarrhea, nausea and vomiting.  Genitourinary:  Negative for dysuria, flank pain, frequency, hematuria and urgency.  Musculoskeletal:   Negative for falls, joint pain and myalgias.  Skin:  Negative for itching and rash.  Neurological:  Negative for dizziness, tingling, speech change, weakness and headaches.  Endo/Heme/Allergies:  Negative for polydipsia. Does not bruise/bleed easily.  Psychiatric/Behavioral:  Negative for depression and memory loss. The patient is not nervous/anxious and does not have insomnia.      OBJECTIVE:    BP 120/62   Pulse 65   Ht 5' 9.5 (1.765 m)   Wt 214 lb 12.8 oz (97.4 kg)   SpO2 98%   BMI 31.27 kg/m   BP Readings from Last 3 Encounters:  07/05/24 120/62  07/01/23 120/78  06/27/23 122/74    Wt Readings from Last 3 Encounters:  07/05/24 214 lb 12.8 oz (97.4 kg)  07/01/23 213 lb (96.6 kg)  06/27/23 216 lb (98 kg)    Physical Exam   General appearance: alert, no distress, WD/WN, Caucasian male Skin: unremarkable HEENT: normocephalic, conjunctiva/corneas normal, sclerae anicteric, PERRLA, EOMi, nares patent, no discharge or erythema, pharynx normal Oral cavity: MMM, tongue normal, teeth normal Neck: supple, no lymphadenopathy, no thyromegaly, no masses, normal ROM, no bruits Chest: non tender, normal shape and expansion Heart: RRR, normal S1, S2, no murmurs Lungs: CTA bilaterally, no wheezes, rhonchi, or rales Abdomen: +bs, soft, non tender, non distended, no masses, no hepatomegaly, no splenomegaly, no bruits Back: non tender, normal ROM, no scoliosis Musculoskeletal: upper extremities non tender, no obvious deformity, normal ROM throughout, lower extremities non tender, no obvious deformity, normal ROM throughout Extremities: no edema, no cyanosis, no clubbing Pulses: 2+ symmetric, upper and lower extremities, normal cap refill Neurological: alert, oriented x 3, CN2-12 intact, strength normal upper extremities and lower extremities, sensation normal throughout, DTRs 2+ throughout, no cerebellar signs, gait normal Psychiatric: normal affect, behavior normal, pleasant  GU:  normal male external genitalia,circumcised, nontender, no masses, no hernia, no lymphadenopathy Rectal: deferred   ASSESSMENT/PLAN:   Encounter Diagnoses  Name Primary?   Encounter for health maintenance examination in adult Yes   Vaccine counseling    Mixed dyslipidemia    Screen for STD (sexually transmitted disease)    Screening for diabetes mellitus    Cardiomegaly      Hyperlipidemia Chronic hyperlipidemia with high triglycerides, LDL >130, low HDL. No atherosclerosis on chest CT from earlier this year. Family history of hyperlipidemia and cardiovascular issues. Prefers lifestyle modifications. - Encourage dietary changes to reduce cholesterol intake, including reducing consumption of egg yolks, shellfish, fatty meats, butter, cheese, and ice cream. - Promote consumption of healthy oils, whole grains, and plain nuts. - Check cholesterol levels with routine labs. -refuses medication  Cardiomegaly Mild cardiomegaly on chest CT. No atherosclerosis. Family history of EDS. Echocardiogram recommended for genetic concerns. - Refer to cardiology for baseline consult and echocardiogram.  Pulmonary  nodule Pulmonary nodule noted on chest CT from October last year, benign appearing, stable. No immediate action required as per previous assessments.      This visit was a preventative care visit, also known as wellness visit or routine physical.   Topics typically include healthy lifestyle, diet, exercise, preventative care, vaccinations, sick and well care, proper use of emergency dept and after hours care, as well as other concerns.      General Recommendations: Continue to return yearly for your annual wellness and preventative care visits.  This gives us  a chance to discuss healthy lifestyle, exercise, vaccinations, review your chart record, and perform screenings where appropriate.  I recommend you see your eye doctor yearly for routine vision care.  I recommend you see your  dentist yearly for routine dental care including hygiene visits twice yearly.   Vaccination  Immunization History  Administered Date(s) Administered   HPV 9-valent 07/05/2024   Tdap 09/29/2018    Vaccine recommendations: Flu, HPV  Vaccines administered today: Counseled on the Human Papilloma virus vaccine.  HPV vaccine given after consent obtained.  Patient was advised to return in 2 months for HPV #2, and in 6 months for HPV #3.      Screening for cancer: Colon cancer screening: Age 67   Testicular cancer screening You should do a monthly self testicular exam if you are between 36-41 years old, and we typically do a testicular exam on the yearly physical for this same age group.   Prostate Cancer screening: The recommended prostate cancer screening test is a blood test called the prostate-specific antigen (PSA) test. PSA is a protein that is made in the prostate. As you age, your prostate naturally produces more PSA. Abnormally high PSA levels may be caused by: Prostate cancer. An enlarged prostate that is not caused by cancer (benign prostatic hyperplasia, or BPH). This condition is very common in older men. A prostate gland infection (prostatitis) or urinary tract infection. Certain medicines such as male hormones (like testosterone ) or other medicines that raise testosterone  levels. A rectal exam may be done as part of prostate cancer screening to help provide information about the size of your prostate gland. When a rectal exam is performed, it should be done after the PSA level is drawn to avoid any effect on the results.   Skin cancer screening: Check your skin regularly for new changes, growing lesions, or other lesions of concern Come in for evaluation if you have skin lesions of concern.   Lung cancer screening: If you have a greater than 20 pack year history of tobacco use, then you may qualify for lung cancer screening with a chest CT scan.   Please call your  insurance company to inquire about coverage for this test.   Pancreatic cancer:  no current screening test is available or routinely recommended. 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  Symptoms: jaundice, dark urine, light color or greasy stools, itchy skin, belly or back pain, weight loss, poor appetite, nause, vomiting, liver enlargement, DVT/blood clots.   We currently don't have screenings for other cancers besides breast, cervical, colon, and lung cancers.  If you have a strong family history of cancer or have other cancer screening concerns, please let me know.  Genetic testing referral is an option for individuals with high cancer risk in the family.  There are some other cancer  screenings in development currently.   Bone health: Get at least 150 minutes of aerobic exercise weekly Get weight bearing exercise at least once weekly Bone density test:  A bone density test is an imaging test that uses a type of X-ray to measure the amount of calcium and other minerals in your bones. The test may be used to diagnose or screen you for a condition that causes weak or thin bones (osteoporosis), predict your risk for a broken bone (fracture), or determine how well your osteoporosis treatment is working. The bone density test is recommended for females 65 and older, or females or males <65 if certain risk factors such as thyroid  disease, long term use of steroids such as for asthma or rheumatological issues, vitamin D  deficiency, estrogen deficiency, family history of osteoporosis, self or family history of fragility fracture in first degree relative.    Heart health: Get at least 150 minutes of aerobic exercise weekly Limit alcohol It is important to maintain a healthy blood pressure and healthy cholesterol  numbers  Heart disease screening: Screening for heart disease includes screening for blood pressure, fasting lipids, glucose/diabetes screening, BMI height to weight ratio, reviewed of smoking status, physical activity, and diet.    Goals include blood pressure 120/80 or less, maintaining a healthy lipid/cholesterol profile, preventing diabetes or keeping diabetes numbers under good control, not smoking or using tobacco products, exercising most days per week or at least 150 minutes per week of exercise, and eating healthy variety of fruits and vegetables, healthy oils, and avoiding unhealthy food choices like fried food, fast food, high sugar and high cholesterol foods.    Other tests may possibly include EKG test, CT coronary calcium score, echocardiogram, exercise treadmill stress test.     Referral to cardiology for baseline evaluation   Vascular disease screening: For higher risk individuals including smokers, diabetics, patients with known heart disease or high blood pressure, kidney disease, and others, screening for vascular disease or atherosclerosis of the arteries is available.  Examples may include carotid ultrasound, abdominal aortic ultrasound, ABI blood flow screening in the legs, thoracic aorta screening.   Medical care options: I recommend you continue to seek care here first for routine care.  We try really hard to have available appointments Monday through Friday daytime hours for sick visits, acute visits, and physicals.  Urgent care should be used for after hours and weekends for significant issues that cannot wait till the next day.  The emergency department should be used for significant potentially life-threatening emergencies.  The emergency department is expensive, can often have long wait times for less significant concerns, so try to utilize primary care, urgent care, or telemedicine when possible to avoid unnecessary trips to the emergency department.  Virtual visits and  telemedicine have been introduced since the pandemic started in 2020, and can be convenient ways to receive medical care.  We offer virtual appointments as well to assist you in a variety of options to seek medical care.   Legal  Take the time to do a last will and testament, Advanced Directives including Health Care Power of Attorney and Living Will documents.  Don't leave your family with burdens that can be handled ahead of time.   Advanced Directives: I recommend you consider completing a Health Care Power of Attorney and Living Will.   These documents respect your wishes and help alleviate burdens on your loved ones if you were to become terminally ill or be in a position to need those  documents enforced.    You can complete Advanced Directives yourself, have them notarized, then have copies made for our office, for you and for anybody you feel should have them in safe keeping.  Or, you can have an attorney prepare these documents.   If you haven't updated your Last Will and Testament in a while, it may be worthwhile having an attorney prepare these documents together and save on some costs.       Jaequan was seen today for annual exam.  Diagnoses and all orders for this visit:  Encounter for health maintenance examination in adult -     CBC -     Comprehensive metabolic panel with GFR -     Lipid panel -     RPR+HIV+GC+CT Panel -     Hepatitis C antibody -     Hepatitis B surface antigen -     Hemoglobin A1c -     Ambulatory referral to Cardiology  Vaccine counseling -     HPV 9-valent vaccine,Recombinat  Mixed dyslipidemia -     Lipid panel  Screen for STD (sexually transmitted disease) -     RPR+HIV+GC+CT Panel -     Hepatitis C antibody -     Hepatitis B surface antigen  Screening for diabetes mellitus -     Hemoglobin A1c  Cardiomegaly -     Ambulatory referral to Cardiology    I recommend follow up yearly for a routine physical.   [facility]

## 2024-07-06 ENCOUNTER — Ambulatory Visit: Payer: Self-pay | Admitting: Medical

## 2024-07-06 NOTE — Progress Notes (Signed)
 Results through MyChart

## 2024-07-07 LAB — RPR+HIV+GC+CT PANEL
HIV Screen 4th Generation wRfx: NONREACTIVE
RPR Ser Ql: NONREACTIVE

## 2024-07-07 LAB — HEPATITIS C ANTIBODY: Hep C Virus Ab: NONREACTIVE

## 2024-07-07 LAB — COMPREHENSIVE METABOLIC PANEL WITH GFR
ALT: 27 IU/L (ref 0–44)
AST: 17 IU/L (ref 0–40)
Albumin: 4.6 g/dL (ref 4.1–5.1)
Alkaline Phosphatase: 71 IU/L (ref 47–123)
BUN/Creatinine Ratio: 10 (ref 9–20)
BUN: 11 mg/dL (ref 6–20)
Bilirubin Total: 0.5 mg/dL (ref 0.0–1.2)
CO2: 24 mmol/L (ref 20–29)
Calcium: 9.8 mg/dL (ref 8.7–10.2)
Chloride: 103 mmol/L (ref 96–106)
Creatinine, Ser: 1.06 mg/dL (ref 0.76–1.27)
Globulin, Total: 2.5 g/dL (ref 1.5–4.5)
Glucose: 110 mg/dL — ABNORMAL HIGH (ref 70–99)
Potassium: 4.6 mmol/L (ref 3.5–5.2)
Sodium: 142 mmol/L (ref 134–144)
Total Protein: 7.1 g/dL (ref 6.0–8.5)
eGFR: 92 mL/min/1.73 (ref >59)

## 2024-07-07 LAB — CBC
Hematocrit: 46.8 % (ref 37.5–51.0)
Hemoglobin: 15.6 g/dL (ref 13.0–17.7)
MCH: 30.1 pg (ref 26.6–33.0)
MCHC: 33.3 g/dL (ref 31.5–35.7)
MCV: 90 fL (ref 79–97)
Platelets: 321 x10E3/uL (ref 150–450)
RBC: 5.18 x10E6/uL (ref 4.14–5.80)
RDW: 12.3 % (ref 11.6–15.4)
WBC: 6.1 x10E3/uL (ref 3.4–10.8)

## 2024-07-07 LAB — LIPID PANEL
Cholesterol, Total: 246 mg/dL — AB (ref 100–199)
HDL: 37 mg/dL — AB (ref >39)
LDL CALC COMMENT:: 6.6 ratio — AB (ref 0.0–5.0)
LDL Chol Calc (NIH): 155 mg/dL — AB (ref 0–99)
Triglycerides: 292 mg/dL — AB (ref 0–149)
VLDL Cholesterol Cal: 54 mg/dL — AB (ref 5–40)

## 2024-07-07 LAB — HEMOGLOBIN A1C
Est. average glucose Bld gHb Est-mCnc: 120 mg/dL
Hgb A1c MFr Bld: 5.8 % — ABNORMAL HIGH (ref 4.8–5.6)

## 2024-07-07 LAB — HEPATITIS B SURFACE ANTIGEN

## 2024-07-07 NOTE — Progress Notes (Signed)
 Results through MyChart

## 2024-09-06 ENCOUNTER — Other Ambulatory Visit: Payer: Self-pay

## 2024-09-06 DIAGNOSIS — Z23 Encounter for immunization: Secondary | ICD-10-CM | POA: Diagnosis not present

## 2024-09-06 NOTE — Progress Notes (Unsigned)
 Patient is in office today for a nurse visit for Immunization. Patient Injection was given in the  Left deltoid. Patient tolerated injection well.

## 2025-03-07 ENCOUNTER — Other Ambulatory Visit

## 2025-07-11 ENCOUNTER — Encounter: Payer: Self-pay | Admitting: Medical
